# Patient Record
Sex: Female | Born: 1980 | Race: Black or African American | Hispanic: No | Marital: Single | Smoking: Former smoker
Health system: Southern US, Community
[De-identification: ages and names within clinical notes are randomized; demographics above are authoritative.]

## PROBLEM LIST (undated history)

## (undated) DIAGNOSIS — I10 Essential (primary) hypertension: Secondary | ICD-10-CM

## (undated) DIAGNOSIS — R569 Unspecified convulsions: Secondary | ICD-10-CM

## (undated) HISTORY — DX: Unspecified convulsions: R56.9

## (undated) HISTORY — PX: ANKLE SURGERY: SHX546

---

## 2020-10-06 ENCOUNTER — Encounter (HOSPITAL_COMMUNITY): Payer: Self-pay

## 2020-10-06 ENCOUNTER — Other Ambulatory Visit: Payer: Self-pay

## 2020-10-06 ENCOUNTER — Emergency Department (HOSPITAL_COMMUNITY)
Admission: EM | Admit: 2020-10-06 | Discharge: 2020-10-06 | Disposition: A | Discharge status: Home / Self Care | Payer: Medicaid - Out of State | Attending: Emergency Medicine | Admitting: Emergency Medicine

## 2020-10-06 ENCOUNTER — Emergency Department (HOSPITAL_COMMUNITY): Payer: Medicaid - Out of State

## 2020-10-06 DIAGNOSIS — R110 Nausea: Secondary | ICD-10-CM | POA: Insufficient documentation

## 2020-10-06 DIAGNOSIS — I471 Supraventricular tachycardia: Secondary | ICD-10-CM | POA: Diagnosis not present

## 2020-10-06 DIAGNOSIS — R002 Palpitations: Secondary | ICD-10-CM | POA: Diagnosis present

## 2020-10-06 LAB — CBC WITH DIFFERENTIAL/PLATELET
Abs Immature Granulocytes: 0.04 10*3/uL (ref 0.00–0.07)
Basophils Absolute: 0 10*3/uL (ref 0.0–0.1)
Basophils Relative: 0 %
Eosinophils Absolute: 0 10*3/uL (ref 0.0–0.5)
Eosinophils Relative: 1 %
HCT: 36.9 % (ref 36.0–46.0)
Hemoglobin: 11.4 g/dL — ABNORMAL LOW (ref 12.0–15.0)
Immature Granulocytes: 1 %
Lymphocytes Relative: 26 %
Lymphs Abs: 2 10*3/uL (ref 0.7–4.0)
MCH: 25.5 pg — ABNORMAL LOW (ref 26.0–34.0)
MCHC: 30.9 g/dL (ref 30.0–36.0)
MCV: 82.6 fL (ref 80.0–100.0)
Monocytes Absolute: 0.2 10*3/uL (ref 0.1–1.0)
Monocytes Relative: 3 %
Neutro Abs: 5.3 10*3/uL (ref 1.7–7.7)
Neutrophils Relative %: 69 %
Platelets: 357 10*3/uL (ref 150–400)
RBC: 4.47 MIL/uL (ref 3.87–5.11)
RDW: 20.9 % — ABNORMAL HIGH (ref 11.5–15.5)
WBC: 7.6 10*3/uL (ref 4.0–10.5)
nRBC: 0 % (ref 0.0–0.2)

## 2020-10-06 LAB — COMPREHENSIVE METABOLIC PANEL
ALT: 45 U/L — ABNORMAL HIGH (ref 0–44)
AST: 77 U/L — ABNORMAL HIGH (ref 15–41)
Albumin: 3.7 g/dL (ref 3.5–5.0)
Alkaline Phosphatase: 69 U/L (ref 38–126)
Anion gap: 11 (ref 5–15)
BUN: 9 mg/dL (ref 6–20)
CO2: 20 mmol/L — ABNORMAL LOW (ref 22–32)
Calcium: 9 mg/dL (ref 8.9–10.3)
Chloride: 102 mmol/L (ref 98–111)
Creatinine, Ser: 1.02 mg/dL — ABNORMAL HIGH (ref 0.44–1.00)
GFR, Estimated: >60 mL/min (ref >60)
Glucose, Bld: 100 mg/dL — ABNORMAL HIGH (ref 70–99)
Potassium: 3.3 mmol/L — ABNORMAL LOW (ref 3.5–5.1)
Sodium: 133 mmol/L — ABNORMAL LOW (ref 135–145)
Total Bilirubin: 1.1 mg/dL (ref 0.3–1.2)
Total Protein: 7.1 g/dL (ref 6.5–8.1)

## 2020-10-06 LAB — URINALYSIS, ROUTINE W REFLEX MICROSCOPIC
Bilirubin Urine: NEGATIVE
Glucose, UA: NEGATIVE mg/dL
Hgb urine dipstick: NEGATIVE
Ketones, ur: NEGATIVE mg/dL
Leukocytes,Ua: NEGATIVE
Nitrite: NEGATIVE
Protein, ur: NEGATIVE mg/dL
Specific Gravity, Urine: 1.004 — ABNORMAL LOW (ref 1.005–1.030)
pH: 5 (ref 5.0–8.0)

## 2020-10-06 LAB — PREGNANCY, URINE: Preg Test, Ur: NEGATIVE

## 2020-10-06 LAB — TSH: TSH: 2.239 u[IU]/mL (ref 0.350–4.500)

## 2020-10-06 IMAGING — DX DG CHEST 1V PORT
1 series · 1 of 1 positions shown · non-contrast
Comparison: None.

CLINICAL DATA: SVT

EXAM:
PORTABLE CHEST 1 VIEW

[chest]
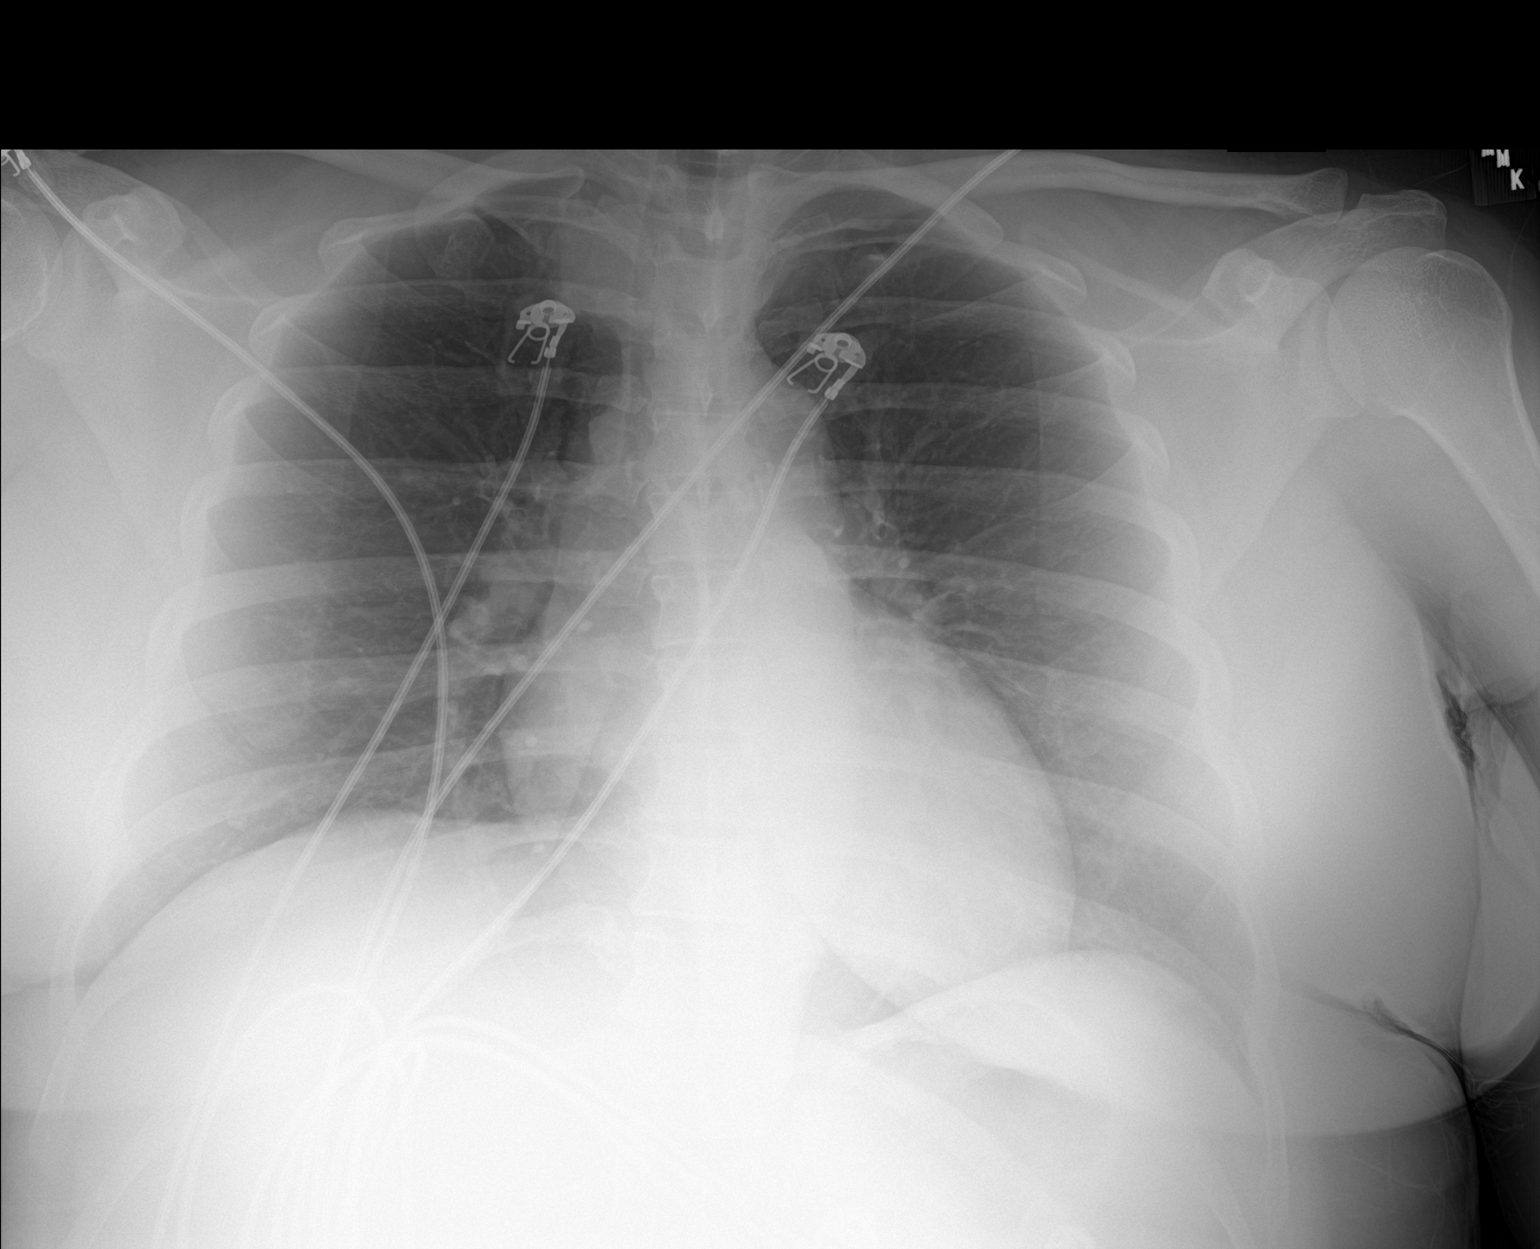

[1 of 1 positions shown; findings below may reference images not displayed]

FINDINGS: The heart size and mediastinal contours are within normal limits.
Both lungs are clear. The visualized skeletal structures are
unremarkable.
IMPRESSION: No active disease.

## 2020-10-06 NOTE — ED Triage Notes (Signed)
Pt BIB [facility] from home c/o [facility] and chest tightness for 2-3 days. Called EMS today when she started feeling chest tightness per EMS pt was in SVT with HR in the 200's. Pt received 6mg  of adenosine without conversion. Pt then received 12 mg of adenosine and converted. Pt is now ST at 103. Pt also received 500 NS with EMS. Pt denies any pain at this time.

## 2020-10-06 NOTE — ED Provider Notes (Signed)
[facility] EMERGENCY DEPARTMENT Provider Note   CSN: [id number] Arrival date & time: 10/06/20  1653     History Chief Complaint  Patient presents with   svt    Sharon Hanson is a 40 y.o. female.  Pt was in SVT with EMS.  Pt was given adenosine 6mg  with no relief and 12 mg with return to sinus rhythm. Pt reports she had a similar episode in the past and she vomited and it resolved.  Pt denies any medical problems.  The history is provided by the patient. No language interpreter was used.  Palpitations Palpitations quality:  Fast Onset quality:  Unable to specify Duration:  1 day Timing:  Constant Progression:  Worsening Chronicity:  New Context: not anxiety, not caffeine, not exercise, not hyperventilation and not illicit drugs   Relieved by:  Nothing Worsened by:  Nothing Ineffective treatments:  None tried Associated symptoms: nausea   Risk factors: no diabetes mellitus       History reviewed. No pertinent past medical history.  There are no problems to display for this patient.      OB History   No obstetric history on file.     History reviewed. No pertinent family history.     Home Medications Prior to Admission medications   Not on File    Allergies    Patient has no known allergies.  Review of Systems   Review of Systems  Cardiovascular:  Positive for palpitations.  Gastrointestinal:  Positive for nausea.  All other systems reviewed and are negative.  Physical Exam Updated Vital Signs BP (!) 161/125   Pulse (!) 106   Resp (!) 21   Ht 5\' 7"  (1.702 m)   Wt 117.9 kg   LMP 10/03/2020   SpO2 100%   BMI 40.72 kg/m   Physical Exam Vitals and nursing note reviewed.  Constitutional:      Appearance: She is well-developed.  HENT:     Head: Normocephalic.     Mouth/Throat:     Mouth: Mucous membranes are moist.  Cardiovascular:     Rate and Rhythm: Tachycardia present.  Pulmonary:     Effort: Pulmonary effort is  normal.  Abdominal:     General: Abdomen is flat. There is no distension.     Palpations: Abdomen is soft.  Musculoskeletal:        General: Normal range of motion.     Cervical back: Normal range of motion.  Skin:    General: Skin is warm.  Neurological:     General: No focal deficit present.     Mental Status: She is alert and oriented to person, place, and time.  Psychiatric:        Mood and Affect: Mood normal.    ED Results / Procedures / Treatments   Labs (all labs ordered are listed, but only abnormal results are displayed) Labs Reviewed  TSH  CBC WITH DIFFERENTIAL/PLATELET  COMPREHENSIVE METABOLIC PANEL  URINALYSIS, ROUTINE W REFLEX MICROSCOPIC  TSH  PREGNANCY, URINE    EKG EKG Interpretation  Date/Time:  Monday October 06 2020 17:18:22 EDT Ventricular Rate:  112 PR Interval:  117 QRS Duration: 94 QT Interval:  330 QTC Calculation: 451 R Axis:   67 Text Interpretation: Sinus tachycardia No significant change since last tracing Confirmed by Saturday [phone]) on 10/06/2020 5:23:08 PM  Radiology DG Chest Port 1 View  Result Date: 10/06/2020 CLINICAL DATA:  SVT EXAM: PORTABLE CHEST 1 VIEW COMPARISON:  None. FINDINGS:  The heart size and mediastinal contours are within normal limits. Both lungs are clear. The visualized skeletal structures are unremarkable. IMPRESSION: No active disease. Electronically Signed   By: Jasmine Pang M.D.   On: 10/06/2020 18:12                            Insert SmartText                                                                        Important            Important             Important            Important               Excuses           Preview All       Print All             Suggestion                 Work         [employer]           Caregiver            TRX Med Necessity   Procedures Procedures   Medications Ordered in ED Medications - No data to display  ED Course  I have reviewed the triage vital signs and the nursing notes.  Pertinent labs & imaging results that were available during my care of the patient were reviewed by me and considered in my medical decision making (see chart for details).    MDM Rules/Calculators/A&P                            Final Clinical Impression(s) / ED Diagnoses Final diagnoses:  SVT (supraventricular tachycardia) (HCC)    Rx / DC Orders ED Discharge Orders     None     Pt's care turned over to Dr. Adela Lank.  Labs pending   Osie Cheeks 10/07/20 1246    Melene Plan, DO 10/07/20 2317

## 2020-10-06 NOTE — ED Provider Notes (Signed)
I provided a substantive portion of the care of this patient.  I personally performed the entirety of the exam and medical decision making for this encounter.  Patient with SVT.  Has a history in the past that resolved after vomiting.  She is now in a sinus rhythm.  Persistently sinus tachycardia.  Feel less likely to be PE.  Family feels like it stress related.  Thyroid studies negative not pregnant no significant anemia no significant electrolyte abnormality.  We will have her follow-up with a cardiologist in the office.  EKG Interpretation  Date/Time:  Monday October 06 2020 17:18:22 EDT Ventricular Rate:  112 PR Interval:  117 QRS Duration: 94 QT Interval:  330 QTC Calculation: 451 R Axis:   67 Text Interpretation: Sinus tachycardia No significant change since last tracing Confirmed by Melene Plan [phone]) on 10/06/2020 5:23:08 PM   See the written copy of this report in the patient's paper medical record.  These results did not interface directly into the electronic medical record and are summarized here.    Melene Plan, DO 10/06/20 2034

## 2020-10-06 NOTE — Discharge Instructions (Addendum)
Try not to take anything that can speed your heart rate up, caffeine stimulants like methamphetamines or cocaine.  Please follow-up with your doctor in the office.

## 2021-01-08 ENCOUNTER — Observation Stay (HOSPITAL_COMMUNITY): Payer: Self-pay

## 2021-01-08 ENCOUNTER — Encounter (HOSPITAL_COMMUNITY): Payer: Self-pay | Admitting: Oncology

## 2021-01-08 ENCOUNTER — Inpatient Hospital Stay (HOSPITAL_COMMUNITY)
Admission: EM | Admit: 2021-01-08 | Discharge: 2021-01-13 | DRG: 227 | Disposition: A | Discharge status: Home / Self Care | Payer: Self-pay | Attending: Internal Medicine | Admitting: Internal Medicine

## 2021-01-08 ENCOUNTER — Emergency Department (HOSPITAL_COMMUNITY): Payer: Self-pay

## 2021-01-08 ENCOUNTER — Other Ambulatory Visit: Payer: Self-pay

## 2021-01-08 DIAGNOSIS — I493 Ventricular premature depolarization: Secondary | ICD-10-CM | POA: Diagnosis present

## 2021-01-08 DIAGNOSIS — I10 Essential (primary) hypertension: Secondary | ICD-10-CM | POA: Diagnosis present

## 2021-01-08 DIAGNOSIS — E871 Hypo-osmolality and hyponatremia: Secondary | ICD-10-CM | POA: Diagnosis present

## 2021-01-08 DIAGNOSIS — Z20822 Contact with and (suspected) exposure to covid-19: Secondary | ICD-10-CM | POA: Diagnosis present

## 2021-01-08 DIAGNOSIS — I471 Supraventricular tachycardia: Secondary | ICD-10-CM | POA: Diagnosis present

## 2021-01-08 DIAGNOSIS — R030 Elevated blood-pressure reading, without diagnosis of hypertension: Secondary | ICD-10-CM

## 2021-01-08 DIAGNOSIS — R7401 Elevation of levels of liver transaminase levels: Secondary | ICD-10-CM | POA: Diagnosis present

## 2021-01-08 DIAGNOSIS — R569 Unspecified convulsions: Secondary | ICD-10-CM

## 2021-01-08 DIAGNOSIS — Z95818 Presence of other cardiac implants and grafts: Secondary | ICD-10-CM

## 2021-01-08 DIAGNOSIS — I472 Ventricular tachycardia, unspecified: Principal | ICD-10-CM | POA: Diagnosis present

## 2021-01-08 DIAGNOSIS — Z6838 Body mass index (BMI) 38.0-38.9, adult: Secondary | ICD-10-CM

## 2021-01-08 DIAGNOSIS — E876 Hypokalemia: Secondary | ICD-10-CM | POA: Diagnosis present

## 2021-01-08 DIAGNOSIS — E669 Obesity, unspecified: Secondary | ICD-10-CM | POA: Diagnosis present

## 2021-01-08 DIAGNOSIS — F419 Anxiety disorder, unspecified: Secondary | ICD-10-CM | POA: Diagnosis present

## 2021-01-08 DIAGNOSIS — W19XXXA Unspecified fall, initial encounter: Secondary | ICD-10-CM

## 2021-01-08 DIAGNOSIS — M25569 Pain in unspecified knee: Secondary | ICD-10-CM

## 2021-01-08 DIAGNOSIS — I255 Ischemic cardiomyopathy: Secondary | ICD-10-CM | POA: Diagnosis present

## 2021-01-08 DIAGNOSIS — G40909 Epilepsy, unspecified, not intractable, without status epilepticus: Secondary | ICD-10-CM | POA: Diagnosis present

## 2021-01-08 DIAGNOSIS — Z87891 Personal history of nicotine dependence: Secondary | ICD-10-CM

## 2021-01-08 HISTORY — DX: Essential (primary) hypertension: I10

## 2021-01-08 LAB — COMPREHENSIVE METABOLIC PANEL
ALT: 67 U/L — ABNORMAL HIGH (ref 0–44)
AST: 93 U/L — ABNORMAL HIGH (ref 15–41)
Albumin: 4.1 g/dL (ref 3.5–5.0)
Alkaline Phosphatase: 99 U/L (ref 38–126)
Anion gap: 13 (ref 5–15)
BUN: 13 mg/dL (ref 6–20)
CO2: 21 mmol/L — ABNORMAL LOW (ref 22–32)
Calcium: 8.2 mg/dL — ABNORMAL LOW (ref 8.9–10.3)
Chloride: 99 mmol/L (ref 98–111)
Creatinine, Ser: 1.1 mg/dL — ABNORMAL HIGH (ref 0.44–1.00)
GFR, Estimated: >60 mL/min (ref >60)
Glucose, Bld: 111 mg/dL — ABNORMAL HIGH (ref 70–99)
Potassium: 3.4 mmol/L — ABNORMAL LOW (ref 3.5–5.1)
Sodium: 133 mmol/L — ABNORMAL LOW (ref 135–145)
Total Bilirubin: 1.2 mg/dL (ref 0.3–1.2)
Total Protein: 7.8 g/dL (ref 6.5–8.1)

## 2021-01-08 LAB — ETHANOL: Alcohol, Ethyl (B): <10 mg/dL (ref <10)

## 2021-01-08 LAB — RESP PANEL BY RT-PCR (FLU A&B, COVID) ARPGX2
Influenza A by PCR: NEGATIVE
Influenza B by PCR: NEGATIVE
SARS Coronavirus 2 by RT PCR: NEGATIVE

## 2021-01-08 LAB — BASIC METABOLIC PANEL
Anion gap: 13 (ref 5–15)
BUN: 7 mg/dL (ref 6–20)
CO2: 20 mmol/L — ABNORMAL LOW (ref 22–32)
Calcium: 8.1 mg/dL — ABNORMAL LOW (ref 8.9–10.3)
Chloride: 101 mmol/L (ref 98–111)
Creatinine, Ser: 1.09 mg/dL — ABNORMAL HIGH (ref 0.44–1.00)
GFR, Estimated: >60 mL/min (ref >60)
Glucose, Bld: 98 mg/dL (ref 70–99)
Potassium: 3.3 mmol/L — ABNORMAL LOW (ref 3.5–5.1)
Sodium: 134 mmol/L — ABNORMAL LOW (ref 135–145)

## 2021-01-08 LAB — CBC WITH DIFFERENTIAL/PLATELET
Abs Immature Granulocytes: 0.05 10*3/uL (ref 0.00–0.07)
Basophils Absolute: 0 10*3/uL (ref 0.0–0.1)
Basophils Relative: 0 %
Eosinophils Absolute: 0 10*3/uL (ref 0.0–0.5)
Eosinophils Relative: 0 %
HCT: 39.8 % (ref 36.0–46.0)
Hemoglobin: 12.5 g/dL (ref 12.0–15.0)
Immature Granulocytes: 1 %
Lymphocytes Relative: 16 %
Lymphs Abs: 1.3 10*3/uL (ref 0.7–4.0)
MCH: 26.4 pg (ref 26.0–34.0)
MCHC: 31.4 g/dL (ref 30.0–36.0)
MCV: 84.1 fL (ref 80.0–100.0)
Monocytes Absolute: 0.2 10*3/uL (ref 0.1–1.0)
Monocytes Relative: 3 %
Neutro Abs: 6.6 10*3/uL (ref 1.7–7.7)
Neutrophils Relative %: 80 %
Platelets: 350 10*3/uL (ref 150–400)
RBC: 4.73 MIL/uL (ref 3.87–5.11)
RDW: 21.2 % — ABNORMAL HIGH (ref 11.5–15.5)
WBC: 8.3 10*3/uL (ref 4.0–10.5)
nRBC: 0 % (ref 0.0–0.2)

## 2021-01-08 LAB — I-STAT BETA HCG BLOOD, ED (MC, WL, AP ONLY): I-stat hCG, quantitative: <5 m[IU]/mL (ref <5)

## 2021-01-08 LAB — MAGNESIUM
Magnesium: 2.2 mg/dL (ref 1.7–2.4)
Magnesium: 1.9 mg/dL (ref 1.7–2.4)

## 2021-01-08 IMAGING — CT CT HEAD W/O CM
3 series · 15 of 47 positions shown, 18 images · non-contrast
Comparison: None.

CLINICAL DATA: Seizure, nontraumatic (Age 18-40y)

EXAM:
CT HEAD WITHOUT CONTRAST
TECHNIQUE: Contiguous axial images were obtained from the base of the skull
through the vertex without intravenous contrast.

[Series 2: head wo · axial · 0.47mm/px · z∈[-130,+0]mm · 9 of 32 slices shown, 12 images]
[im 3/32  brain]
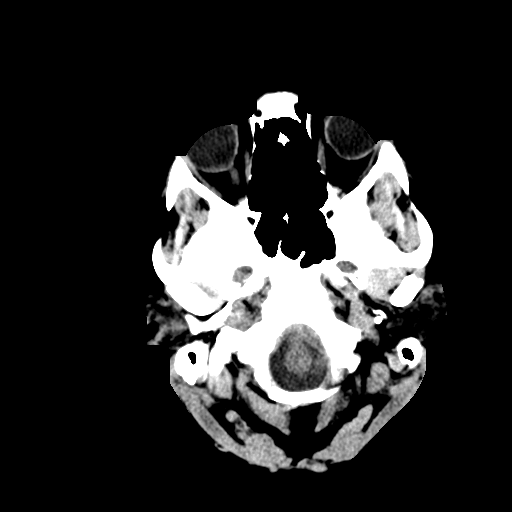
[im 3/32  bone]
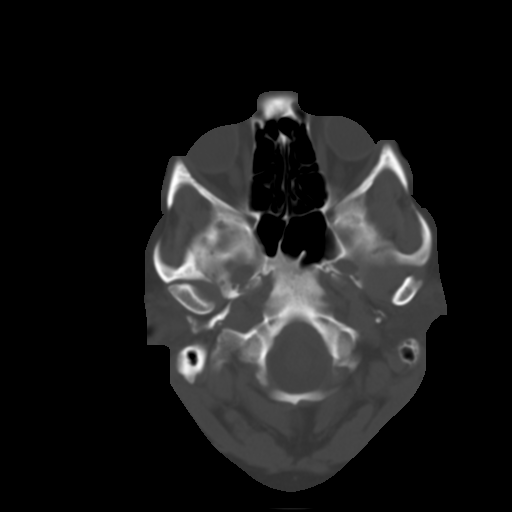
[im 6/32  brain]
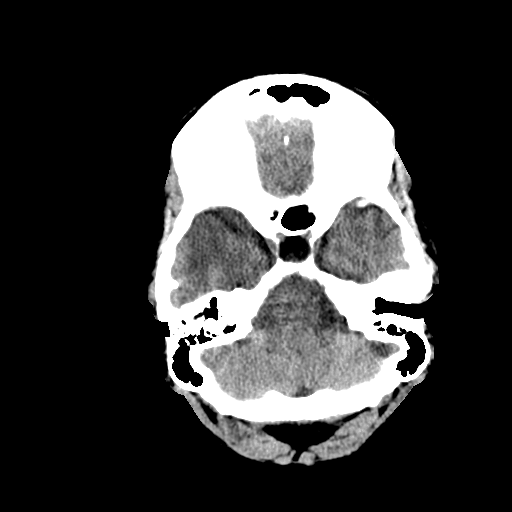
[im 9/32  brain]
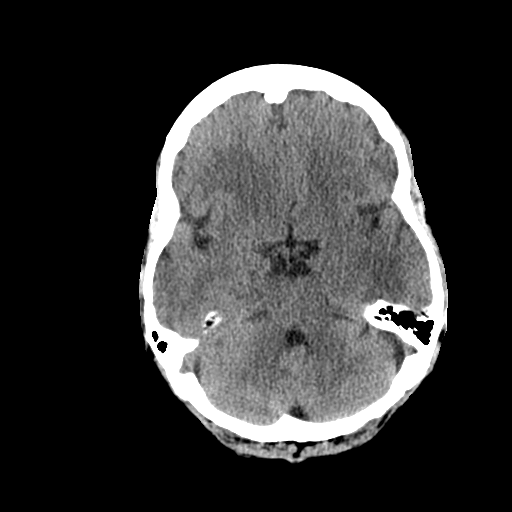
[im 12/32  brain]
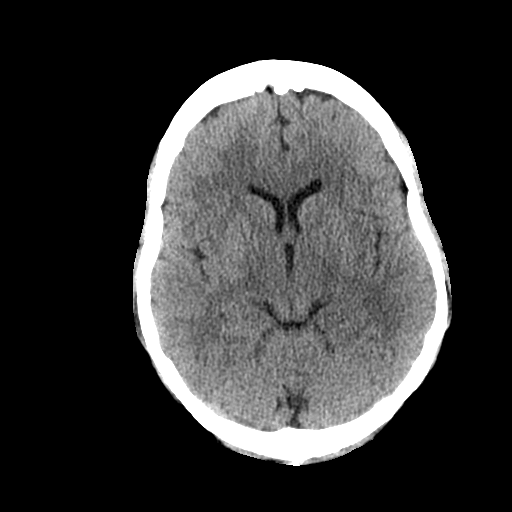
[im 17/32  brain]
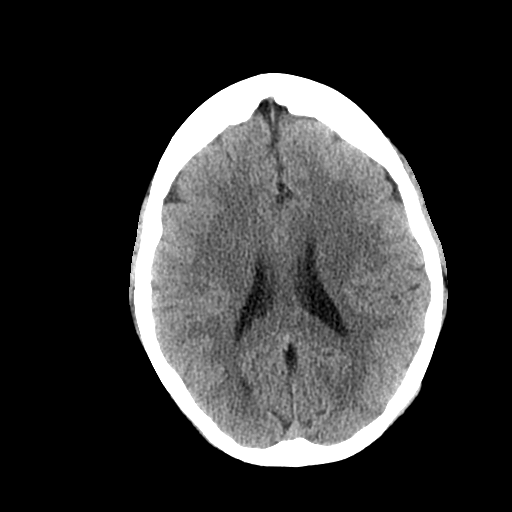
[im 17/32  bone]
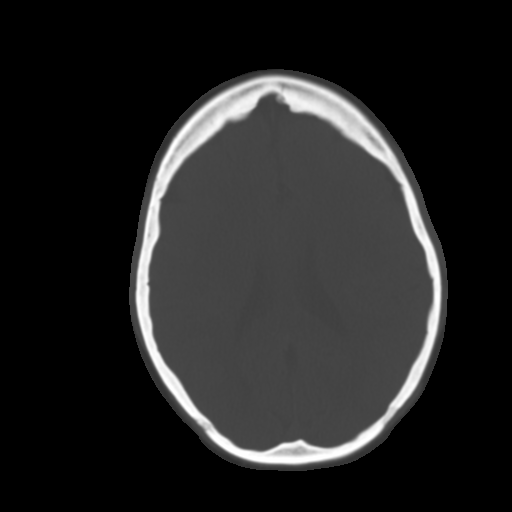
[im 20/32  brain]
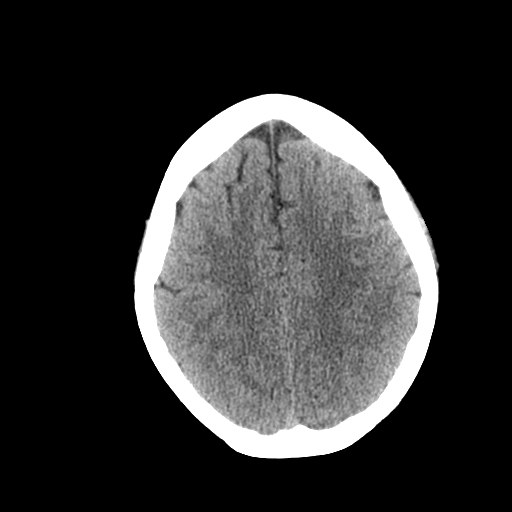
[im 23/32  brain]
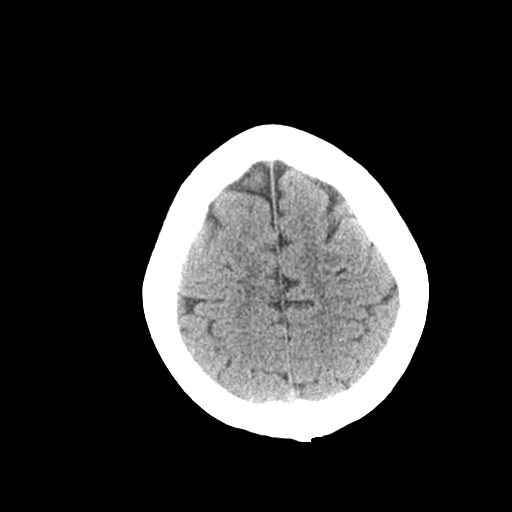
[im 26/32  brain]
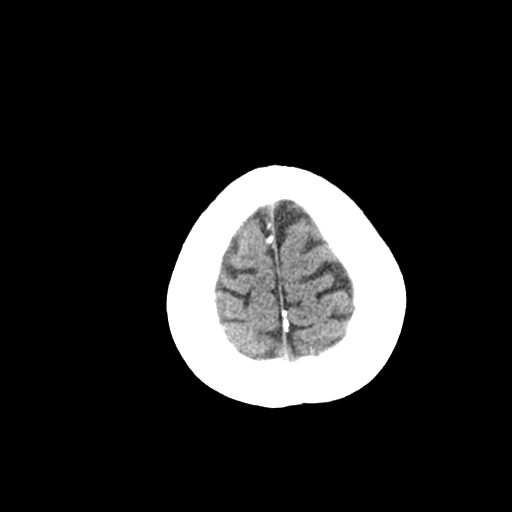
[im 29/32  brain]
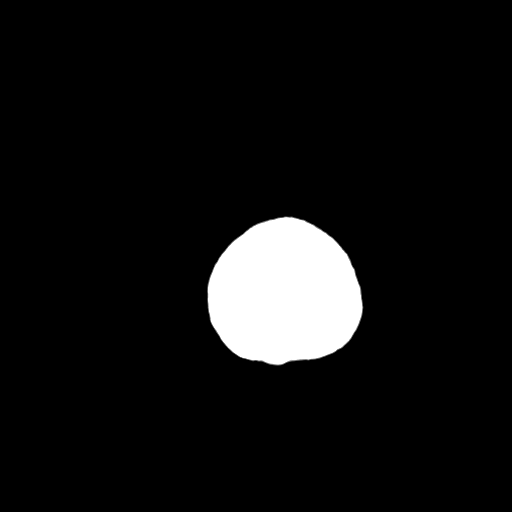
[im 29/32  bone]
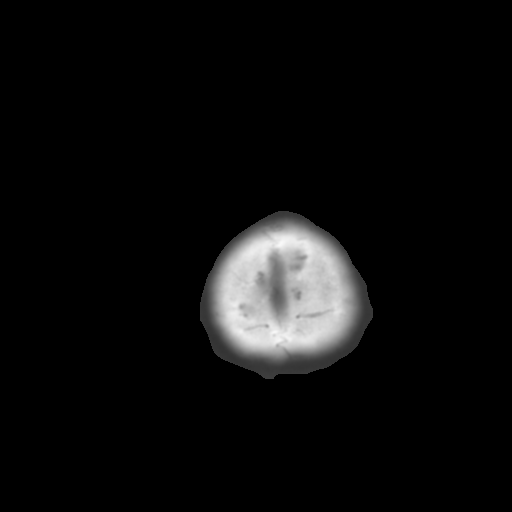

[Series 5: coronal soft tissue · coronal · 0.33mm/px · 3 of 74 slices shown]
[im 25/74  brain]
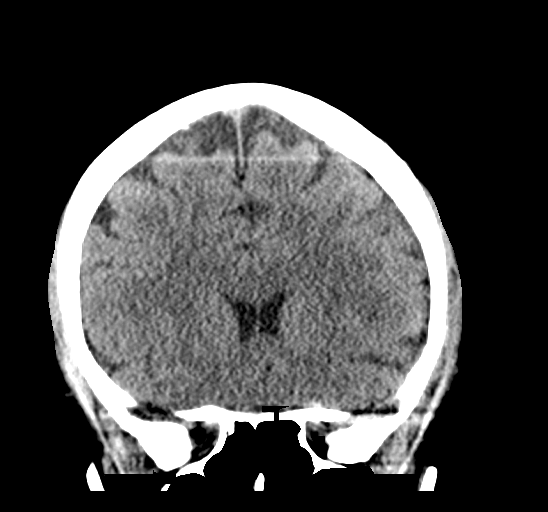
[im 33/74  brain]
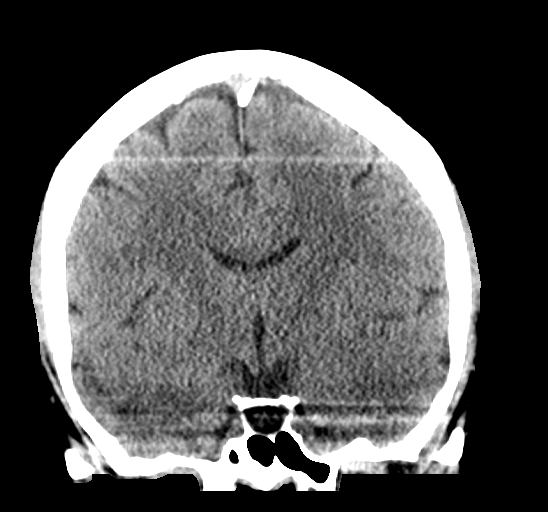
[im 41/74  brain]
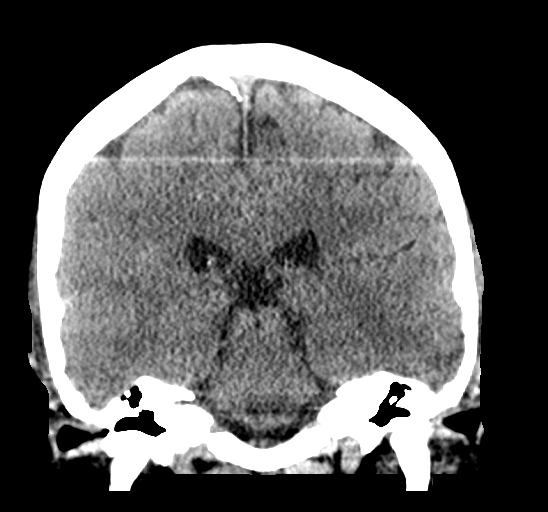

[Series 6: sagittal soft tissue · sagittal · 0.31mm/px · 3 of 64 slices shown]
[im 22/64  brain]
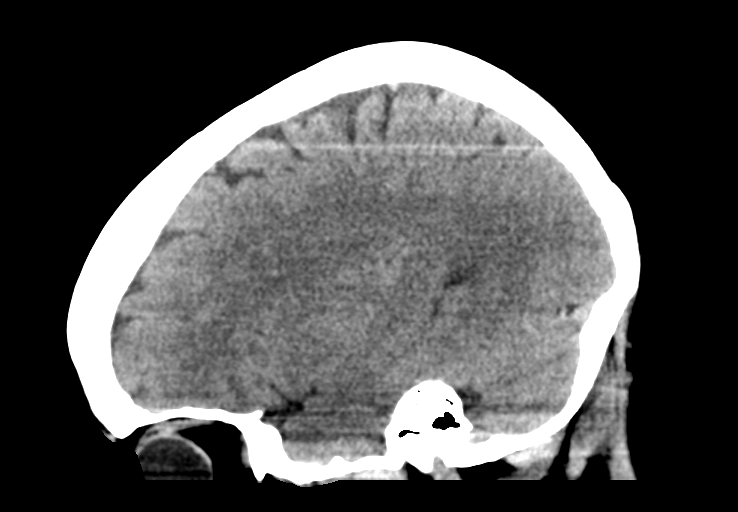
[im 32/64  brain]
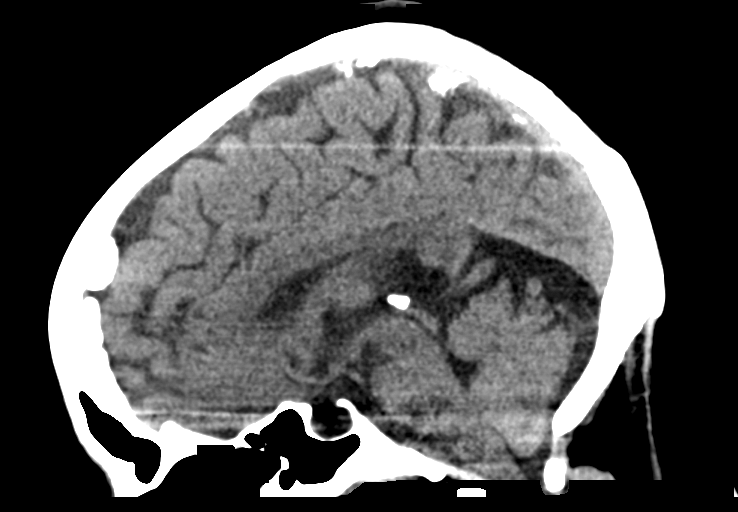
[im 43/64  brain]
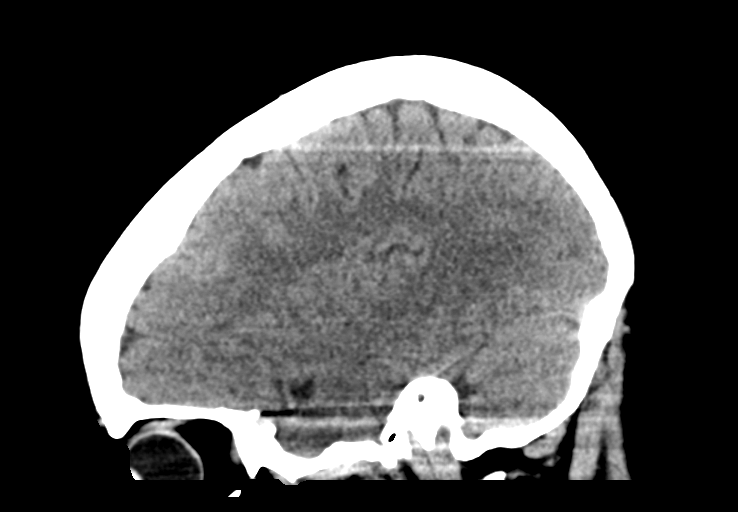

[15 of 47 positions shown; findings below may reference images not displayed]

FINDINGS: Brain: No acute intracranial abnormality. Specifically, no
hemorrhage, hydrocephalus, mass lesion, acute infarction, or
significant intracranial injury.

Vascular: No hyperdense vessel or unexpected calcification.

Skull: No acute calvarial abnormality.

Sinuses/Orbits: No acute findings

Other: None
IMPRESSION: Normal study.

## 2021-01-08 IMAGING — MR MR HEAD W/O CM
11 series · 48 of 48 positions shown · non-contrast
Comparison: None.

CLINICAL DATA: Seizure, nontraumatic (Age 18-40y)

EXAM:
MRI HEAD WITHOUT CONTRAST
TECHNIQUE: Multiplanar, multiecho pulse sequences of the brain and surrounding
structures were obtained without intravenous contrast.

[Series 5: DWI · axial · 5.0mm · 1.31mm/px · z∈[-74,+80]mm · 4 of 64 slices shown (1 of 4)]
[im 1/64]
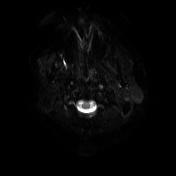
[im 22/64]
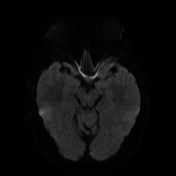
[im 43/64]
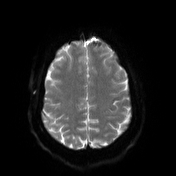
[im 64/64]
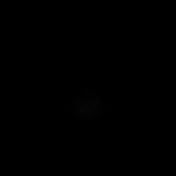

[Series 6: DWI · axial · 5.0mm · 1.31mm/px · z∈[-74,+80]mm · 2 of 32 slices shown (2 of 4)]
[im 1/32]
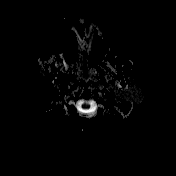
[im 32/32]
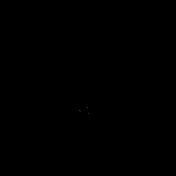

[Series 7: T1 · sagittal · 5.0mm · 0.72mm/px · 2 of 24 slices shown (1 of 2)]
[im 1/24]
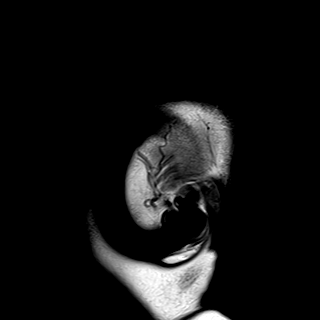
[im 24/24]
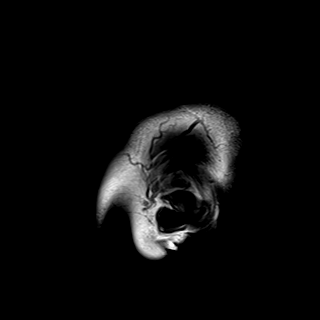

[Series 8: T2 · axial · 5.0mm · 0.60mm/px · z∈[-72,+82]mm · 3 of 32 slices shown (1 of 2)]
[im 1/32]
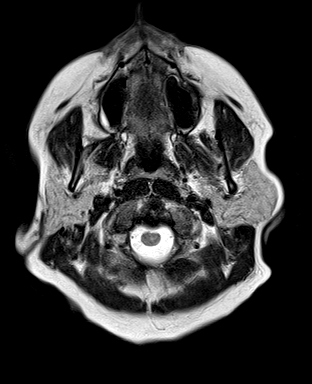
[im 16/32]
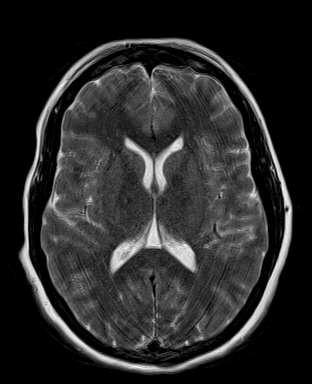
[im 32/32]
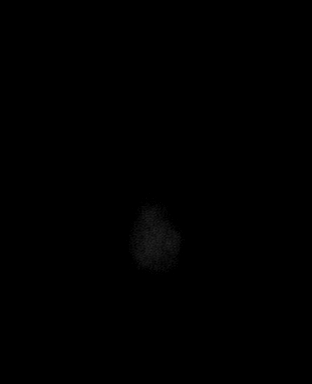

[Series 9: swi_images · axial · 3.0mm · 0.75mm/px · z∈[-77,+87]mm · 5 of 56 slices shown]
[im 1/56]
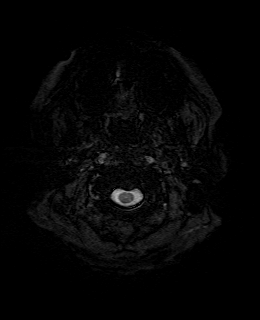
[im 14/56]
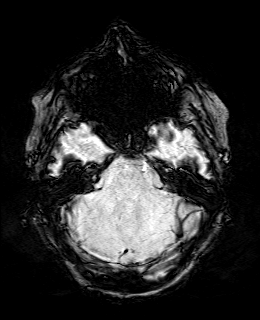
[im 28/56]
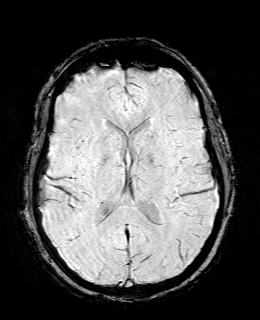
[im 42/56]
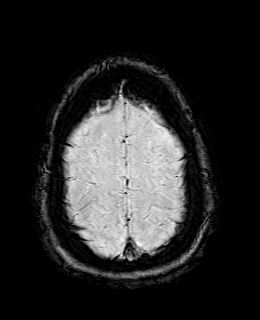
[im 56/56]
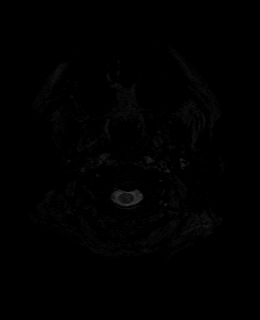

[Series 11: FLAIR · axial · 5.0mm · 0.75mm/px · z∈[-72,+82]mm · 3 of 32 slices shown (1 of 2)]
[im 1/32]
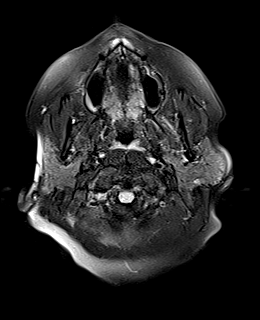
[im 16/32]
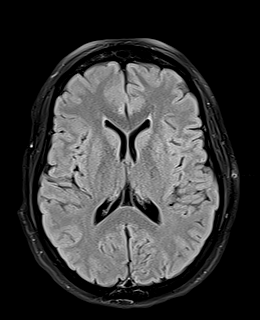
[im 32/32]
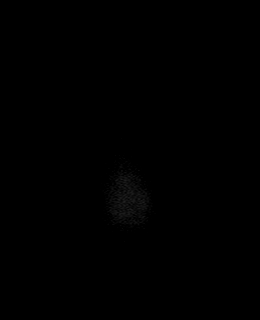

[Series 12: T2 · coronal · 3.0mm · 0.56mm/px · 3 of 34 slices shown (2 of 2)]
[im 1/34]
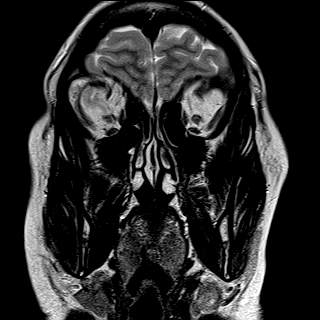
[im 17/34]
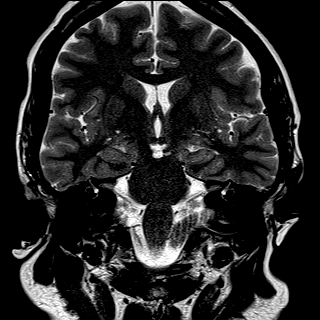
[im 34/34]
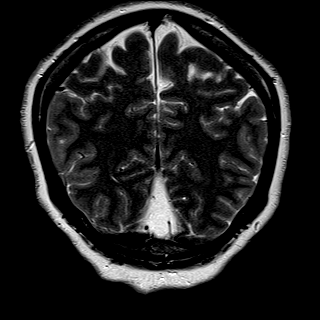

[Series 13: FLAIR · coronal · 3.0mm · 0.70mm/px · 3 of 34 slices shown (2 of 2)]
[im 1/34]
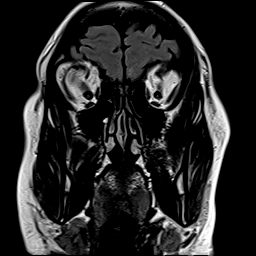
[im 17/34]
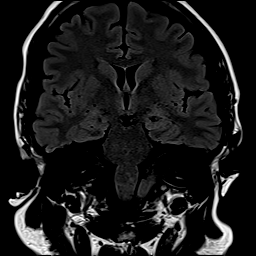
[im 34/34]
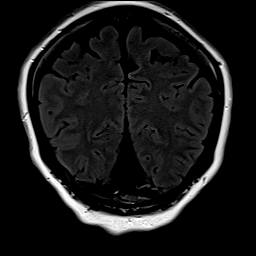

[Series 14: DWI · coronal · 5.0mm · 1.31mm/px · 5 of 63 slices shown (3 of 4)]
[im 1/63]
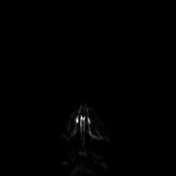
[im 16/63]
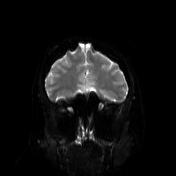
[im 32/63]
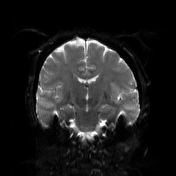
[im 47/63]
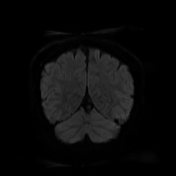
[im 63/63]
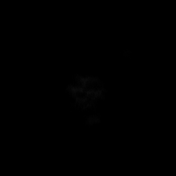

[Series 15: DWI · coronal · 5.0mm · 1.31mm/px · 3 of 32 slices shown (4 of 4)]
[im 1/32]
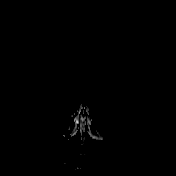
[im 16/32]
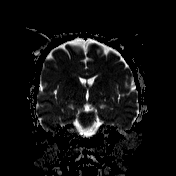
[im 32/32]
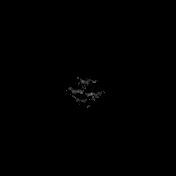

[Series 16: T1 · axial · 1.0mm · 0.94mm/px · z∈[-89,+85]mm · 15 of 176 slices shown (2 of 2)]
[im 1/176]
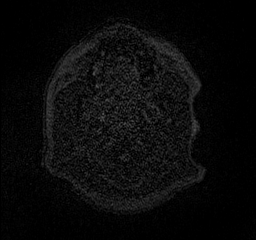
[im 13/176]
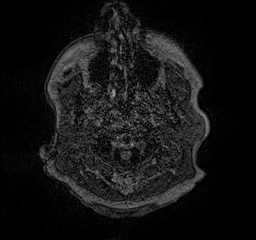
[im 26/176]
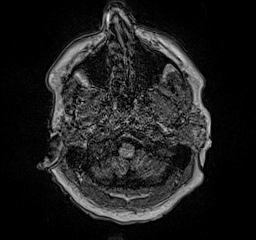
[im 38/176]
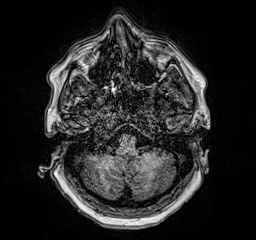
[im 51/176]
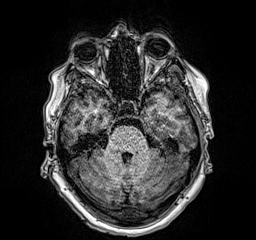
[im 63/176]
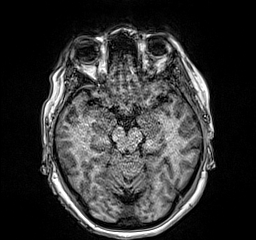
[im 76/176]
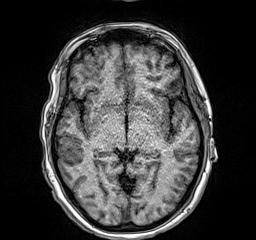
[im 88/176]
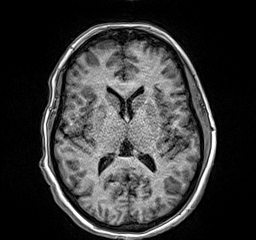
[im 101/176]
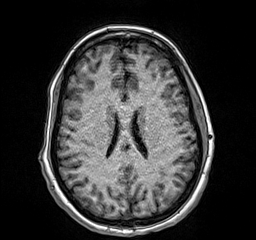
[im 113/176]
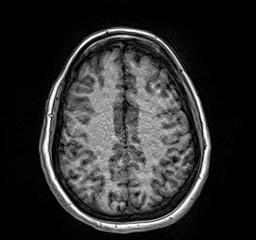
[im 126/176]
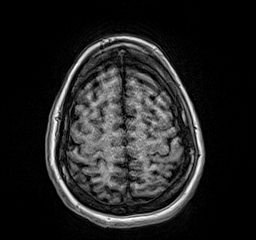
[im 138/176]
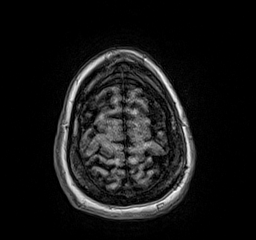
[im 151/176]
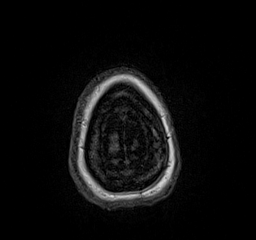
[im 163/176]
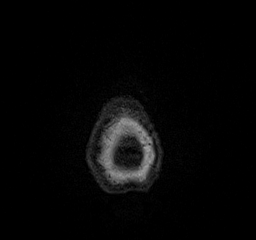
[im 176/176]
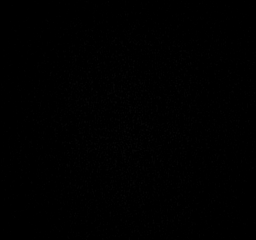

[48 of 48 positions shown; findings below may reference images not displayed]

FINDINGS: Mildly motion study.

Brain: No acute infarction, hemorrhage, hydrocephalus, extra-axial
collection or mass lesion. The hippocampi are within normal limits
and symmetric in size/signal. Partially empty and expanded sella.

Vascular: Major arterial flow voids are maintained at the skull
base.

Skull and upper cervical spine: Normal marrow signal.

Sinuses/Orbits: Clear sinuses.  Unremarkable orbits.

Other: No mastoid effusions.
IMPRESSION: 1. No evidence of acute abnormality.
2. Partially empty and expanded sella, which is often a normal
anatomic variant but can be associated with idiopathic intracranial
hypertension.

## 2021-01-08 IMAGING — DX DG KNEE COMPLETE 4+V*R*
4 series · 4 of 4 positions shown · non-contrast
Comparison: None.

CLINICAL DATA: Fall, right knee pain.

EXAM:
RIGHT KNEE - COMPLETE 4+ VIEW

[knee ap]
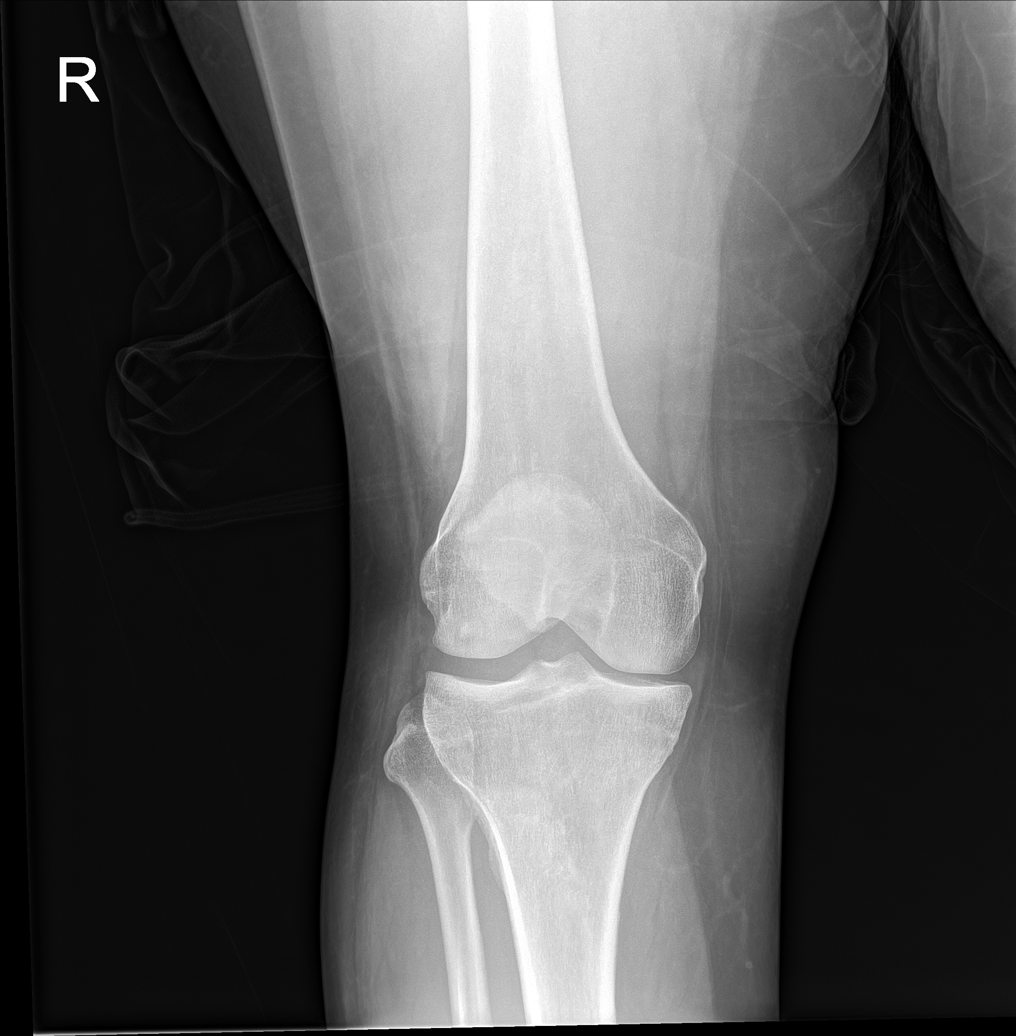

[knee lat]
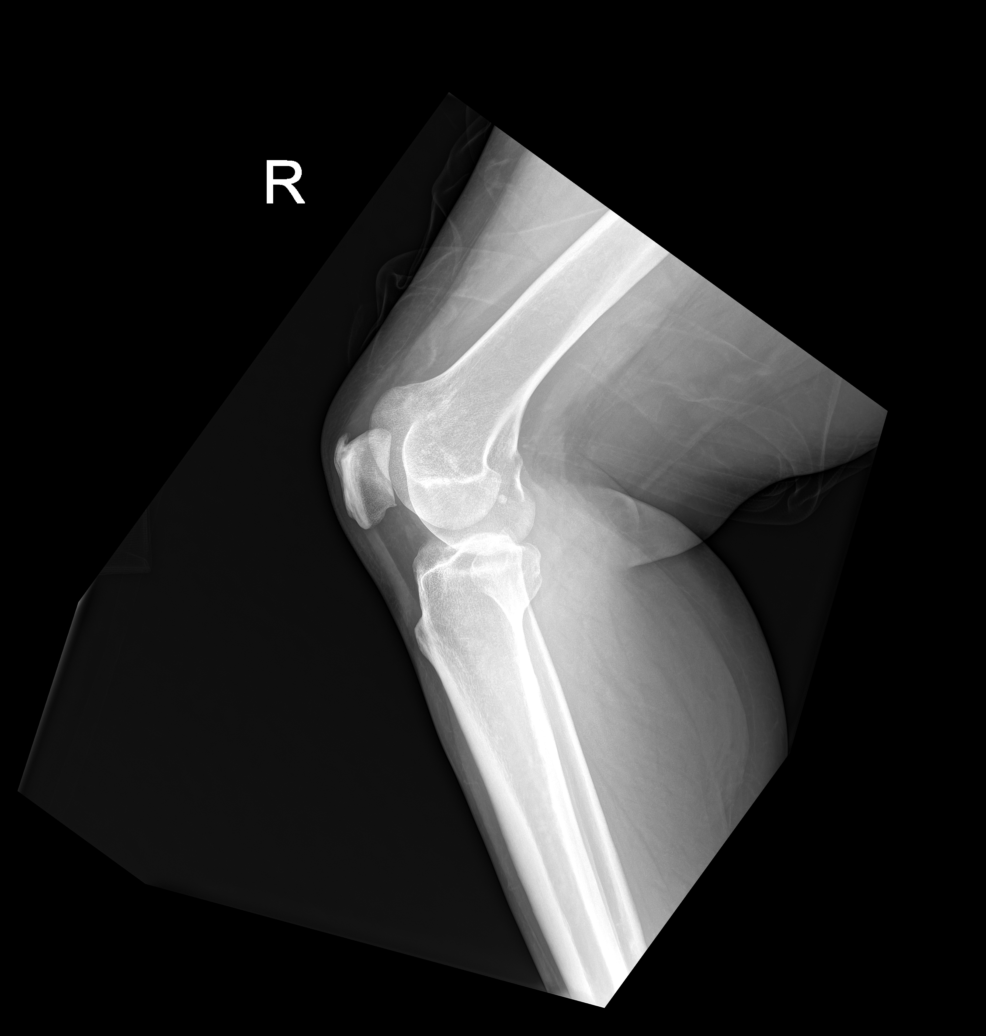

[knee obl (1 of 2)]
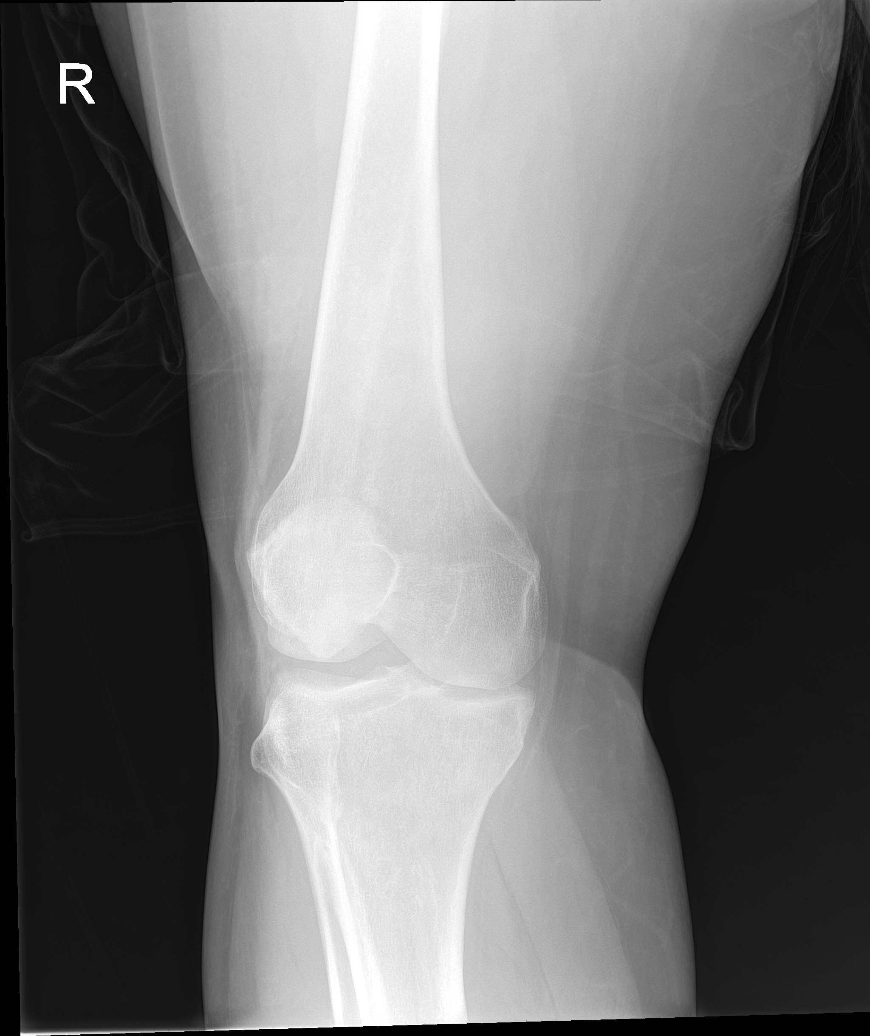

[knee obl (2 of 2)]
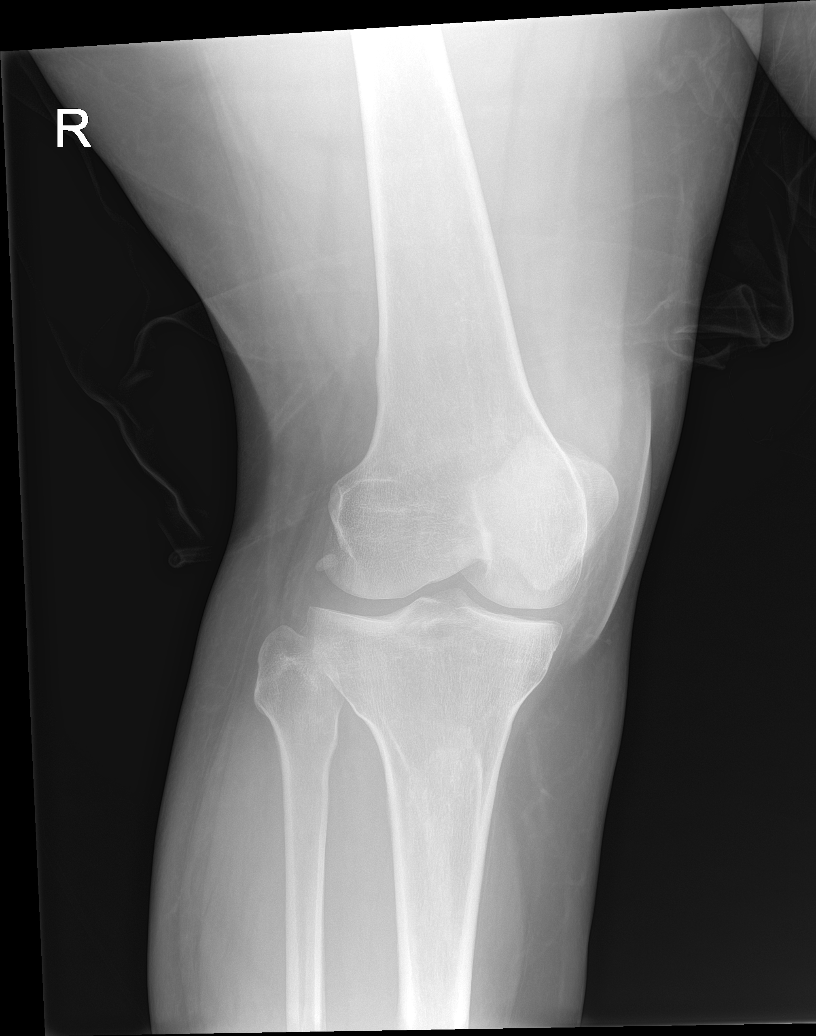

[4 of 4 positions shown; findings below may reference images not displayed]

FINDINGS: No evidence of fracture, dislocation, or joint effusion. Mild medial
tibiofemoral joint space narrowing. Trace patellofemoral spurring.
There is a small quadriceps tendon enthesophyte. Soft tissues are
unremarkable.
IMPRESSION: 1. No fracture or dislocation.
2. Mild degenerative change.
3. Small quadriceps tendon enthesophyte.

## 2021-01-08 MED ORDER — METOPROLOL TARTRATE 25 MG PO TABS
25.0000 mg | ORAL_TABLET | Freq: Two times a day (BID) | ORAL | Status: DC
  Administered 2021-01-08 – 2021-01-13 (×10): 25 mg via ORAL
  Filled 2021-01-08 (×10): qty 1

## 2021-01-08 MED ORDER — INFLUENZA VAC SPLIT QUAD 0.5 ML IM SUSY: 0.5000 mL | PREFILLED_SYRINGE | INTRAMUSCULAR | Status: DC

## 2021-01-08 MED ORDER — ACETAMINOPHEN 325 MG PO TABS
650.0000 mg | ORAL_TABLET | Freq: Four times a day (QID) | ORAL | Status: DC | PRN
  Administered 2021-01-08 – 2021-01-11 (×3): 650 mg via ORAL
  Filled 2021-01-08 (×3): qty 2

## 2021-01-08 MED ORDER — AMIODARONE HCL IN DEXTROSE 360-4.14 MG/200ML-% IV SOLN
30.0000 mg/h | INTRAVENOUS | Status: DC
  Administered 2021-01-08 – 2021-01-10 (×4): 30 mg/h via INTRAVENOUS
  Filled 2021-01-08 (×5): qty 200

## 2021-01-08 MED ORDER — AMIODARONE LOAD VIA INFUSION
150.0000 mg | Freq: Once | INTRAVENOUS | Status: AC
  Administered 2021-01-08: 18:00:00 150 mg via INTRAVENOUS
  Filled 2021-01-08: qty 83.34

## 2021-01-08 MED ORDER — LEVETIRACETAM IN NACL 500 MG/100ML IV SOLN
500.0000 mg | Freq: Two times a day (BID) | INTRAVENOUS | Status: DC
  Administered 2021-01-09 – 2021-01-11 (×5): 500 mg via INTRAVENOUS
  Filled 2021-01-08 (×5): qty 100

## 2021-01-08 MED ORDER — POTASSIUM CHLORIDE IN NACL 40-0.9 MEQ/L-% IV SOLN
INTRAVENOUS | Status: AC
  Filled 2021-01-08: qty 1000

## 2021-01-08 MED ORDER — ADENOSINE 6 MG/2ML IV SOLN
INTRAVENOUS | Status: AC
  Filled 2021-01-08: qty 4

## 2021-01-08 MED ORDER — LORAZEPAM 2 MG/ML IJ SOLN
INTRAMUSCULAR | Status: AC
  Filled 2021-01-08: qty 1

## 2021-01-08 MED ORDER — LORAZEPAM 2 MG/ML IJ SOLN
1.0000 mg | INTRAMUSCULAR | Status: DC | PRN
  Administered 2021-01-12: 1 mg via INTRAVENOUS
  Filled 2021-01-08: qty 1

## 2021-01-08 MED ORDER — ACETAMINOPHEN 650 MG RE SUPP: 650.0000 mg | Freq: Four times a day (QID) | RECTAL | Status: DC | PRN

## 2021-01-08 MED ORDER — ENOXAPARIN SODIUM 40 MG/0.4ML IJ SOSY
40.0000 mg | PREFILLED_SYRINGE | INTRAMUSCULAR | Status: DC
  Administered 2021-01-08 – 2021-01-11 (×4): 40 mg via SUBCUTANEOUS
  Filled 2021-01-08 (×4): qty 0.4

## 2021-01-08 MED ORDER — METOPROLOL TARTRATE 5 MG/5ML IV SOLN
5.0000 mg | Freq: Once | INTRAVENOUS | Status: AC
  Administered 2021-01-08: 15:00:00 5 mg via INTRAVENOUS
  Filled 2021-01-08: qty 5

## 2021-01-08 MED ORDER — LEVETIRACETAM IN NACL 1500 MG/100ML IV SOLN
1500.0000 mg | Freq: Once | INTRAVENOUS | Status: AC
  Administered 2021-01-08: 16:00:00 1500 mg via INTRAVENOUS
  Filled 2021-01-08: qty 100

## 2021-01-08 MED ORDER — MAGNESIUM SULFATE 2 GM/50ML IV SOLN
2.0000 g | Freq: Once | INTRAVENOUS | Status: AC
  Administered 2021-01-09: 2 g via INTRAVENOUS
  Filled 2021-01-08: qty 50

## 2021-01-08 MED ORDER — POTASSIUM CHLORIDE CRYS ER 20 MEQ PO TBCR: 40.0000 meq | EXTENDED_RELEASE_TABLET | Freq: Once | ORAL | Status: DC

## 2021-01-08 MED ORDER — AMIODARONE HCL IN DEXTROSE 360-4.14 MG/200ML-% IV SOLN
60.0000 mg/h | INTRAVENOUS | Status: AC
  Administered 2021-01-08: 18:00:00 60 mg/h via INTRAVENOUS
  Filled 2021-01-08: qty 200

## 2021-01-08 NOTE — Progress Notes (Signed)
Amiodarone Drug - Drug Interaction Consult Note  Recommendations: No changes required   Amiodarone is metabolized by the cytochrome P450 system and therefore has the potential to cause many drug interactions. Amiodarone has an average plasma half-life of 50 days (range 20 to 100 days).   There is potential for drug interactions to occur several weeks or months after stopping treatment and the onset of drug interactions may be slow after initiating amiodarone.   []  Statins: Increased risk of myopathy. Simvastatin- restrict dose to 20mg  daily. Other statins: counsel patients to report any muscle pain or weakness immediately.  []  Anticoagulants: Amiodarone can increase anticoagulant effect. Consider warfarin dose reduction. Patients should be monitored closely and the dose of anticoagulant altered accordingly, remembering that amiodarone levels take several weeks to stabilize.  []  Antiepileptics: Amiodarone can increase plasma concentration of phenytoin, the dose should be reduced. Note that small changes in phenytoin dose can result in large changes in levels. Monitor patient and counsel on signs of toxicity.  []  Beta blockers: increased risk of bradycardia, AV block and myocardial depression. Sotalol - avoid concomitant use.  []   Calcium channel blockers (diltiazem and verapamil): increased risk of bradycardia, AV block and myocardial depression.  []   Cyclosporine: Amiodarone increases levels of cyclosporine. Reduced dose of cyclosporine is recommended.  []  Digoxin dose should be halved when amiodarone is started.  []  Diuretics: increased risk of cardiotoxicity if hypokalemia occurs.  []  Oral hypoglycemic agents (glyburide, glipizide, glimepiride): increased risk of hypoglycemia. Patient's glucose levels should be monitored closely when initiating amiodarone therapy.   []  Drugs that prolong the QT interval:  Torsades de pointes risk may be increased with concurrent use - avoid if possible.   Monitor QTc, also keep magnesium/potassium WNL if concurrent therapy can't be avoided.  Antibiotics: e.g. fluoroquinolones, erythromycin.  Antiarrhythmics: e.g. quinidine, procainamide, disopyramide, sotalol.  Antipsychotics: e.g. phenothiazines, haloperidol.   Lithium, tricyclic antidepressants, and methadone.  Thank You, PharmD, BCPS Clinical Pharmacist [facility] main pharmacy [phone] 01/08/2021 5:36 PM

## 2021-01-08 NOTE — Discharge Instructions (Addendum)
Post procedure care instructions (neck, groins) Keep procedure site clean & dry. If you notice increased pain, swelling, bleeding or pus, call/return!  No soaking in baths/hot tubs/pools for 1 week. PLEASE also note restrictions below NO showers until cleared to at your wound check visit You can ambulate normally though no exercising, long periods of walking for 1 week, no vigorous activities, please follow below as well      Supplemental Discharge Instructions for  Pacemaker/Defibrillator Patients  Activity No heavy lifting or vigorous activity with your left/right arm for 6 to 8 weeks.  Do not raise your left/right arm above your head for one week.  Gradually raise your affected arm as drawn below.             01/17/21                  01/18/21                   01/19/21               01/20/21 __  NO DRIVING for  6 months  WOUND CARE Keep the wound area clean and dry.  Do not get this area wet , no showers until cleared to at your wound check visit. The tape/steri-strips on your wound will fall off; do not pull them off.  No bandage is needed on the site.  DO  NOT apply any creams, oils, or ointments to the wound area. If you notice any drainage or discharge from the wound, any swelling or bruising at the site, or you develop a fever > 101? F after you are discharged home, call the office at once.  Special Instructions You are still able to use cellular telephones; use the ear opposite the side where you have your pacemaker/defibrillator.  Avoid carrying your cellular phone near your device. When traveling through airports, show security personnel your identification card to avoid being screened in the metal detectors.  Ask the security personnel to use the hand wand. Avoid arc welding equipment, MRI testing (magnetic resonance imaging), TENS units (transcutaneous nerve stimulators).  Call the office for questions about other devices. Avoid electrical appliances that are in poor  condition or are not properly grounded. Microwave ovens are safe to be near or to operate.  Additional information for defibrillator patients should your device go off: If your device goes off ONCE and you feel fine afterward, notify the device clinic nurses. If your device goes off ONCE and you do not feel well afterward, call 911. If your device goes off TWICE, call 911. If your device goes off THREE times in one day, call 911.  DO NOT DRIVE YOURSELF OR A FAMILY MEMBER WITH A DEFIBRILLATOR TO THE HOSPITAL--CALL 911.       1) Do not drive, swim, take baths, stand on elevated structures, operate heavy machinery, or do any other activities that would be dangerous if you had another seizure.  2) You need to follow up with a primary care doctor for your blood pressure issues.  3)STOP drinking alcohol as this is a major trigger for seizures   Department of Motor Vehicle (DMV) of [place] regulations for seizures - It is the patient's responsibility to report the incidence of the seizure in the state of [place]. [place] has no statutory provision requiring physicians to report patients diagnosed with epilepsy or seizures to a central state agency.  The recommended DMV regulation requirement for a driver in [place] for  an individual with a seizure is that they be seizure-free for 6-12 months. However, the DMV may consider the following exceptions to this general rule where: (1) a physician-directed change in medication causes a seizure and the individual immediately resumes the previous therapy which controlled seizures; (2) there is a history of nocturnal seizures or seizures which do not involve loss of consciousness, loss of control of motor function, or loss of appropriate sensation and information process; and (3) an individual has a seizure disorder preceded by an aura (warning) lasting 2-3 minutes. While the [facility] may also give consideration to other unusual circumstances which may affect the general  requirement that drivers be seizure-free for 6-12 months, interpretation of these circumstances and assignment of restrictions is at the discretion of the Medical Advisor. The DMV also considers compliance with medical therapy essential for safe driving. [facility] Physician's Guide to [employer] (June, 1995 ed.)] The Department learns of an individual's condition by inquiring on the application form or renewal form, a physician's report to the [facility], an accident report or from correspondence from the individual. The person may be required to submit a Medical Report Form either annually or semi-annually.    Contact a health care provider if: You have another seizure. You have seizures more often. Your seizure symptoms change. You continue to have seizures with treatment. You have symptoms of an infection or illness. They might increase your risk of having a seizure. Get help right away if: You have a seizure: That lasts longer than 5 minutes. That is different than previous seizures. That leaves you unable to speak or use a part of your body. That makes it harder to breathe. After a head injury. You have: Multiple seizures in a row. Confusion or a severe headache right after a seizure. You are having seizures more often. You do not wake up immediately after a seizure. You injure yourself during a seizure.

## 2021-01-08 NOTE — ED Notes (Signed)
Pt did bite her tongue.  Pt reports no BP meds x 2 years.

## 2021-01-08 NOTE — ED Provider Notes (Signed)
[facility]-EMERGENCY DEPT Provider Note   CSN: [id number] Arrival date & time: 01/08/21  1243     History Chief Complaint  Patient presents with   Seizures    Sharon Hanson is a 40 y.o. female with no significant past medical history who presents the emergency department for seizure.  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  She had approximately 3 minutes of tonic-clonic activity.  Patient did bite her tongue.  She did not lose control of her bladder or bowel.  She states that earlier in the day she noticed some numbness in her right hand but felt like it had resolved before she had the seizure.  She denies recent headaches, vision change.  She denies aura prior to the seizure.  She states that she did have 4 shots of liquor last night and that this is a frequent occurrence for her.  She does not take any medications and denies any new over-the-counter meds.  She complains of pain at the left occiput.   Seizures     No past medical history on file.  There are no problems to display for this patient.    OB History   No obstetric history on file.     No family history on file.     Home Medications Prior to Admission medications   Not on File    Allergies    Patient has no known allergies.  Review of Systems   Review of Systems  Allergic/Immunologic: Food allergies: .10ros.  Neurological:  Positive for seizures.   Physical Exam Updated Vital Signs BP (!) 166/109 (BP Location: Right Arm)   Pulse 98   Temp 98.1 F (36.7 C) (Oral)   Resp 20   SpO2 97%   Physical Exam Vitals and nursing note reviewed.  Constitutional:      General: She is not in acute distress.    Appearance: She is well-developed. She is not diaphoretic.  HENT:     Head: Normocephalic.     Comments: Small hematoma of the left occiput    Right Ear: External ear normal.     Left Ear:  External ear normal.     Nose: Nose normal.     Mouth/Throat:     Mouth: Mucous membranes are moist.     Comments: Small bite marks to the distal tip of the tongue no lacerations, no bleeding Eyes:     General: No scleral icterus.    Extraocular Movements: Extraocular movements intact.     Conjunctiva/sclera: Conjunctivae normal.     Pupils: Pupils are equal, round, and reactive to light.  Cardiovascular:     Rate and Rhythm: Normal rate and regular rhythm.     Heart sounds: Normal heart sounds. No murmur heard.   No friction rub. No gallop.  Pulmonary:     Effort: Pulmonary effort is normal. No respiratory distress.     Breath sounds: Normal breath sounds.  Abdominal:     General: Bowel sounds are normal. There is no distension.     Palpations: Abdomen is soft. There is no mass.     Tenderness: There is no abdominal tenderness. There is no guarding.  Musculoskeletal:     Cervical back: Normal range of motion.  Skin:    General: Skin is warm and dry.  Neurological:     Mental Status: She is alert and oriented to person, place,  and time.     GCS: GCS eye subscore is 4. GCS verbal subscore is 5. GCS motor subscore is 6.     Cranial Nerves: Cranial nerves 2-12 are intact.     Sensory: Sensation is intact.     Motor: Motor function is intact.     Coordination: Coordination is intact.     Gait: Gait is intact.  Psychiatric:        Behavior: Behavior normal.    ED Results / Procedures / Treatments   Labs (all labs ordered are listed, but only abnormal results are displayed) Labs Reviewed - No data to display  EKG None  Radiology No results found.  Procedures .Critical Care Performed by: Margarita Mail, PA-C Authorized by: Margarita Mail, PA-C   Critical care provider statement:    Critical care time (minutes):  30   Critical care time was exclusive of:  Separately billable procedures and treating other patients   Critical care was necessary to treat or prevent  imminent or life-threatening deterioration of the following conditions:  Cardiac failure and CNS failure or compromise   Critical care was time spent personally by me on the following activities:  Development of treatment plan with patient or surrogate, discussions with consultants, evaluation of patient's response to treatment, examination of patient, ordering and review of laboratory studies, ordering and review of radiographic studies, ordering and performing treatments and interventions, pulse oximetry, re-evaluation of patient's condition and review of old charts   Medications Ordered in ED Medications - No data to display  ED Course  I have reviewed the triage vital signs and the nursing notes.  Pertinent labs & imaging results that were available during my care of the patient were reviewed by me and considered in my medical decision making (see chart for details).  40 year old female who presents with new onset seizures. The differential diagnosis for includes but is not limited to idiopathic seizure, traumatic brain injury, intracranial hemorrhage, vascular lesion, mass or space containing lesion, degenerative neurologic disease, congenital brain abnormality, infectious etiology such as meningitis, encephalitis or abscess, metabolic disturbance including hyper or hypoglycemia, hyper or hyponatremia, hyperosmolar state, uremia, hepatic failure, hypocalcemia, hypomagnesemia.  Toxic substances such as cocaine, lidocaine, antidepressants, theophylline, alcohol withdrawal, drug withdrawal, eclampsia, hypertensive encephalopathy and anoxic brain injury.  I have ordered and reviewed labs that included CBC without significant abnormality.  CMP shows mild hypokalemia, hyponatremia, hypocalcemia all of insignificant value.  Patient's AST and ALT are elevated.  Creatinine just above normal.  I reviewed the patient's magnesium level which is normal and she has a negative hCG..  At shift change patient  was taken to the CT scanner where she proceeded to have a 2-minute tonic-clonic seizure.  She had tongue biting.  Patient was given 2 mg of IV Ativan.  She had resolution of seizure-like activity.  She was moved into a room where she was found to be in SVT at a rate of 224.  Patient was given a single dose of adenosine at bedside followed by a dose of 5 mg of metoprolol.  (Please see documentation by Dr. Alvino Chapel)  Patient seen in bedside consult by Dr. Kathrynn Speed who recommends continuing with ordered loading dose of Keppra 1500 mg.  He will place her on Keppra 500 mg twice daily.  He recommends MRI and EEG along with admission.  Signout given to PA Rona Ravens at shift handoff.  Patient is currently stabilized.   MDM Rules/Calculators/A&P  Final Clinical Impression(s) / ED Diagnoses Final  diagnoses:  Seizure ([place])  Elevated blood pressure reading    Rx / DC Orders ED Discharge Orders     None        Margarita Mail, PA-C 01/08/21 1530    Davonna Belling, MD 01/09/21 0005

## 2021-01-08 NOTE — ED Triage Notes (Signed)
Pt bib [facility] from work d/t seizure.  Per EMS report pt began having seizure activity then fell.  Pt did hit her head has hematoma.  Seizure activity witnessed by Cabin crew.  Co worker stated pt was lethargic immediately after seizure activity.  Hx of HTN non compliant. Pt does not have hx of seizures.

## 2021-01-08 NOTE — Consult Note (Signed)
Neurology Consultation Reason for Consult: Seizures Referring Physician: Robb Matar, D  CC: Seizures  History is obtained from: Chart review  HPI: Sharon Hanson is a 40 y.o. female who reports a history of a seizure as a child, who presented with seizure.  While in the emergency department, she had a second tonic-clonic seizure while in the CT scanner.  She also had V. tach after arriving in the hospital.  Is unclear to me which came first.  As I examined the patient, she was starting to come around, still very lethargic but able to answer some questions.    ROS:  Unable to obtain due to altered mental status.   Past Medical History:  Diagnosis Date   Hypertension      Family History  Problem Relation Age of Onset   Hypertension Other      Social History:  reports that she has quit smoking. Her smoking use included cigars. She has never used smokeless tobacco. She reports current alcohol use. She reports that she does not use drugs.   Exam: Current vital signs: BP 140/87 (BP Location: Left Arm)   Pulse 99   Temp 99.5 F (37.5 C) (Oral)   Resp 20   Ht 5\' 7"  (1.702 m)   Wt 111.1 kg   LMP 12/07/2020 (Approximate)   SpO2 98%   BMI 38.37 kg/m  Vital signs in last 24 hours: Temp:  [98 F (36.7 C)-99.5 F (37.5 C)] 99.5 F (37.5 C) (11/17 1924) Pulse Rate:  [96-111] 99 (11/17 1924) Resp:  [20-25] 20 (11/17 1924) BP: (140-166)/(87-126) 140/87 (11/17 1924) SpO2:  [93 %-100 %] 98 % (11/17 1924) Weight:  [111.1 kg] 111.1 kg (11/17 1535)   Physical Exam  Constitutional: Appears well-developed and well-nourished.  Psych: Too lethargic to answer many questions Eyes: No scleral injection HENT: No OP obstruction MSK: no joint deformities.  Cardiovascular: Normal rate and regular rhythm.  Respiratory: Effort normal, non-labored breathing GI: Soft.  No distension. There is no tenderness.  Skin: WDI  Neuro: Mental Status: Patient is lethargic but she is able to answer  a few questions including to tell me that she had a seizure as a child. Cranial Nerves: II: Visual Fields are full. Pupils are equal, round, and reactive to light.   III,IV, VI: EOMI without ptosis or diploplia.  V: Facial sensation is symmetric to temperature VII: Facial movement is symmetric.  Motor: She moves all extremities to command, does not give good effort to formal testing due to lethargy Sensory: She response to noxious simulation x4 Deep Tendon Reflexes: 2+ and symmetric in the biceps and patellae.  Nonsustained clonus at the ankles Cerebellar: Does not perform   I have reviewed labs in epic and the results pertinent to this consultation are: Hyponatremia with a sodium of 133 Creatinine 1.1 Calcium 8.2 Magnesium 2.2  I have reviewed the images obtained: CT head is normal  Impression: 40 year old female with new onset seizures.  It will be helpful to get more history when she is more awake about her previous seizure history.  For now, I would favor continuing antiepileptic therapy with Keppra and obtaining an MRI and an EEG.  Recommendations: 1) MRI brain 2) EEG 3) Keppra 500 mg twice daily 4) neurology will continue to follow   24, MD [facility] [phone]  If 7pm- 7am, please page neurology on call as listed in [facility].

## 2021-01-08 NOTE — ED Provider Notes (Signed)
Received signout at the beginning of shift, please see previous providers notes for complete H&P.  This is a 40 year old female without significant history of seizure brought here via EMS due to having a seizure activity while at work earlier today.  She developed a second witnessed tonic-clonic seizure while in the CT scanner for head CT scan.  Seizure lasting for approximately 2 minutes, followed by postictal state, and tongue biting.  Patient was found to be tachycardic with heart rates in the 240s, was given 2mg  of ativan and 5 mg of metoprolol.  Neurologist, Dr. to see and evaluate patient and agrees with Keppra loading dose.  Recommend admission for brain MRI and EEG.  Cardiology, Dr. Amada Jupiter have been consulted and recommend starting an amiodarone bolus and drip with the assumption that pt's tachycardiac could be Vtach.  Recommend medicine admission and transfer to [facility].    4:50 PM Appreciate consultation from [facility] who agrees to see pt and will help with the transfer to [facility] for admission.     time spent personally by me on the following activities: Review prior charts, imaging, and labs.  Development of treatment plan with patient and/or surrogate as well as nursing, discussions with consultants, evaluation of patient's response to treatment, examination of patient, obtaining history from patient or surrogate, ordering and performing treatments and interventions, ordering and review of laboratory studies, ordering and review of radiographic studies, pulse oximetry and re-evaluation of patient's condition.  .Critical Care Performed by: [facility], PA-C Authorized by: Fayrene Helper, PA-C   Critical care provider statement:    Critical care time (minutes):  45   Critical care was necessary to treat or prevent imminent or life-threatening deterioration of the following conditions:  CNS failure or compromise and cardiac failure   Critical care was time  spent personally by me on the following activities:  Development of treatment plan with patient or surrogate, discussions with consultants, evaluation of patient's response to treatment, examination of patient, ordering and review of laboratory studies, ordering and review of radiographic studies, ordering and performing treatments and interventions, pulse oximetry, re-evaluation of patient's condition and review of old charts   I assumed direction of critical care for this patient from another provider in my specialty: yes     Care discussed with: admitting provider     BP (!) 151/126   Pulse 98   Temp 98 F (36.7 C) (Oral)   Resp (!) 21   Ht 5\' 7"  (1.702 m)   Wt 111.1 kg   LMP 12/07/2020 (Approximate)   SpO2 93%   BMI 38.37 kg/m   Results for orders placed or performed during the hospital encounter of 01/08/21  Resp Panel by RT-PCR (Flu A&B, Covid) Nasopharyngeal Swab   Specimen: Nasopharyngeal Swab; Nasopharyngeal(NP) swabs in vial transport medium  Result Value Ref Range   SARS Coronavirus 2 by RT PCR NEGATIVE NEGATIVE   Influenza A by PCR NEGATIVE NEGATIVE   Influenza B by PCR NEGATIVE NEGATIVE  Comprehensive metabolic panel  Result Value Ref Range   Sodium 133 (L) 135 - 145 mmol/L   Potassium 3.4 (L) 3.5 - 5.1 mmol/L   Chloride 99 98 - 111 mmol/L   CO2 21 (L) 22 - 32 mmol/L   Glucose, Bld 111 (H) 70 - 99 mg/dL   BUN 13 6 - 20 mg/dL   Creatinine, Ser 12/09/2020 (H) 0.44 - 1.00 mg/dL   Calcium 8.2 (L) 8.9 - 10.3 mg/dL   Total Protein  7.8 6.5 - 8.1 g/dL   Albumin 4.1 3.5 - 5.0 g/dL   AST 93 (H) 15 - 41 U/L   ALT 67 (H) 0 - 44 U/L   Alkaline Phosphatase 99 38 - 126 U/L   Total Bilirubin 1.2 0.3 - 1.2 mg/dL   GFR, Estimated >11 >17 mL/min   Anion gap 13 5 - 15  CBC with Differential/Platelet  Result Value Ref Range   WBC 8.3 4.0 - 10.5 K/uL   RBC 4.73 3.87 - 5.11 MIL/uL   Hemoglobin 12.5 12.0 - 15.0 g/dL   HCT 35.6 70.1 - 41.0 %   MCV 84.1 80.0 - 100.0 fL   MCH 26.4 26.0 -  34.0 pg   MCHC 31.4 30.0 - 36.0 g/dL   RDW 30.1 (H) 31.4 - 38.8 %   Platelets 350 150 - 400 K/uL   nRBC 0.0 0.0 - 0.2 %   Neutrophils Relative % PENDING %   Neutro Abs PENDING 1.7 - 7.7 K/uL   Band Neutrophils PENDING %   Lymphocytes Relative PENDING %   Lymphs Abs PENDING 0.7 - 4.0 K/uL   Monocytes Relative PENDING %   Monocytes Absolute PENDING 0.1 - 1.0 K/uL   Eosinophils Relative PENDING %   Eosinophils Absolute PENDING 0.0 - 0.5 K/uL   Basophils Relative PENDING %   Basophils Absolute PENDING 0.0 - 0.1 K/uL   WBC Morphology PENDING    RBC Morphology PENDING    Smear Review PENDING    Other PENDING %   nRBC PENDING 0 /100 WBC   Metamyelocytes Relative PENDING %   Myelocytes PENDING %   Promyelocytes Relative PENDING %   Blasts PENDING %   Immature Granulocytes PENDING %   Abs Immature Granulocytes PENDING 0.00 - 0.07 K/uL  Magnesium  Result Value Ref Range   Magnesium 2.2 1.7 - 2.4 mg/dL  I-Stat beta hCG blood, ED  Result Value Ref Range   I-stat hCG, quantitative <5.0 <5 mIU/mL   Comment 3           No results found.     Fayrene Helper, PA-C 01/08/21 1652    Benjiman Core, MD 01/09/21 1455

## 2021-01-08 NOTE — ED Notes (Signed)
Pt to MRI at this time.

## 2021-01-08 NOTE — Progress Notes (Addendum)
HOSPITAL MEDICINE OVERNIGHT EVENT NOTE    Notified by admitting provider at [facility] as well as bedside nurse at [facility] the patient is arrived on the medical floor on 3 W.  Unfortunately shortly after arrival, patient stumbled and landed on her right knee upon trying to walk to the bathroom.  Patient is now complaining of knee pain with no visible injury.  Will obtain right knee x-ray.  After discussion with the admitting provider and review of the chart, I have paged and discussed the patient with Dr. Welton Flakes with cardiology to discuss what seems to be ventricular tachycardia that occurred in the [facility] emergency department.  Dr. Welton Flakes recommends continued amiodarone infusion overnight, monitoring on telemetry and replacement of electrolytes as necessary.  Dr. Welton Flakes will ensure that electrophysiology is notified of the patient in the morning for consultation.  Marinda Elk  MD [facility]   ADDENDUM (11/18 12:50AM)  Potassium noted to be 3.3 on repeat chemistry with magnesium of 1.9.  Patient still receiving a remaining portion of intravenous potassium chloride previously ordered.  We will additionally provide patient with 40 millequivalents of oral potassium chloride.  For the Mg 1.9, intravenous magnesium sulfate was ordered earlier in the evening but has yet to be given.  Nursing is to give this shortly.  Target is to achieve target potassium of 4.0 and target magnesium of 2.0.  Deno Lunger Richardo Popoff

## 2021-01-08 NOTE — H&P (Signed)
History and Physical    Sharon Hanson OMV:[id number] DOB: 1981/01/24 DOA: 01/08/2021  PCP: Pcp, No   Patient coming from: Home.   I have personally briefly reviewed patient's old medical records in [facility]  Chief Complaint: Seizures.  HPI: Sharon Hanson is a 40 y.o. female with medical history significant of hypertension, class II obesity who was brought to the emergency department via EMS after having a seizure while at work earlier in the day.  She had a second tonic-clonic seizure while she was in the CT scanner.  There was no fecal or urinary incontinence.  She still mildly sedated and does not remember much of what happened.  She denied any prodromal symptoms, headache, blurred vision, nausea, dizziness or gait imbalance.  No fever, rhinorrhea, sore throat, wheezing or hemoptysis.  No chest pain, diaphoresis, PND, orthopnea or pitting edema of the lower extremities.  She gets frequent palpitations associated with dyspnea, sometimes twice a day.  These usually resolve with rest for about 10 minutes.  No abdominal pain, nausea, emesis, diarrhea, constipation, melena or hematochezia.  No flank pain, dysuria, frequency or hematuria.  No polyuria, polydipsia, polyphagia or blurred vision.  ED Course: Initial vital signs were temperature  98.1 F, pulse 98, respirations 20, BP 166/109 mmHg and O2 sat 97% on room air.  The patient then was loaded with 1500 mg of Keppra IVPB and was started on an amiodarone infusion.  Lab work: Her CBC showed a white count of 8.3, hemoglobin 12.5 g/dL platelets 628.  Differential still pending.  Magnesium was 2.2 mg/dL.  Sodium 133, potassium 3.4, chloride 99 and CO2 21 mmol/L.  Glucose 111, creatinine 1.10 and calcium 8.2 mg/dL.  AST was 93 ALT 67 units/L.  The rest of the CMP was unremarkable.  Imaging: Portable chest radiograph did not have any acute findings.  CT head was normal.  Please see images and full radiology report for further  details.  Review of Systems: As per HPI otherwise all other systems reviewed and are negative.  Past Medical History:  Diagnosis Date   Hypertension    Past Surgical History:  Procedure Laterality Date   ANKLE SURGERY Right    Social History  reports that she has quit smoking. Her smoking use included cigars. She has never used smokeless tobacco. She reports current alcohol use. She reports that she does not use drugs.  No Known Allergies  Family History  Problem Relation Age of Onset   Hypertension Other    Prior to Admission medications   Not on File   Physical Exam: Vitals:   01/08/21 1615 01/08/21 1630 01/08/21 1645 01/08/21 1700  BP: (!) 154/109  (!) 151/126 (!) 160/114  Pulse: (!) 111 (!) 104 98 96  Resp: (!) 25 (!) 21 (!) 21 (!) 24  Temp:      TempSrc:      SpO2: 98% 100% 93% 98%  Weight:      Height:       Constitutional: NAD, calm, comfortable Eyes: PERRL, lids and conjunctivae normal ENMT: Mucous membranes are moist.  Tongue shows self bite injury.  Posterior pharynx clear of any exudate or lesions. Neck: Normal, supple, no masses, no thyromegaly Respiratory: clear to auscultation bilaterally, no wheezing, no crackles. Normal respiratory effort. No accessory muscle use.  Cardiovascular: Sinus tachycardia in the low 100s, regular rate and rhythm, no murmurs / rubs / gallops. No extremity edema. 2+ pedal pulses. No carotid bruits.  Abdomen: Obese, no distention.  Bowel sounds  positive.  Soft, no tenderness, no masses palpated. No hepatosplenomegaly. Musculoskeletal: Mild generalized weakness.  No clubbing / cyanosis.  Believes good ROM, no contractures. Normal muscle tone.  Skin: no acute rashes, lesions, ulcers on very limited examination. Neurologic: CN 2-12 grossly intact. Sensation intact, DTR normal. Strength 5/5 in all 4.  Psychiatric: Mildly somnolent but alert and oriented x 3. Normal mood.   Labs on Admission: I have personally reviewed following labs  and imaging studies  CBC: Recent Labs  Lab 01/08/21 1355  WBC 8.3  NEUTROABS PENDING  HGB 12.5  HCT 39.8  MCV 84.1  PLT [id number]    Basic Metabolic Panel: Recent Labs  Lab 01/08/21 1355  NA 133*  K 3.4*  CL 99  CO2 21*  GLUCOSE 111*  BUN 13  CREATININE 1.10*  CALCIUM 8.2*  MG 2.2    GFR: Estimated Creatinine Clearance: 88.2 mL/min (A) (by C-G formula based on SCr of 1.1 mg/dL (H)).  Liver Function Tests: Recent Labs  Lab 01/08/21 1355  AST 93*  ALT 67*  ALKPHOS 99  BILITOT 1.2  PROT 7.8  ALBUMIN 4.1   Radiological Exams on Admission: CT Head Wo Contrast  Result Date: 01/08/2021 CLINICAL DATA:  Seizure, nontraumatic (Age 66-40y) EXAM: CT HEAD WITHOUT CONTRAST TECHNIQUE: Contiguous axial images were obtained from the base of the skull through the vertex without intravenous contrast. COMPARISON:  None. FINDINGS: Brain: No acute intracranial abnormality. Specifically, no hemorrhage, hydrocephalus, mass lesion, acute infarction, or significant intracranial injury. Vascular: No hyperdense vessel or unexpected calcification. Skull: No acute calvarial abnormality. Sinuses/Orbits: No acute findings Other: None IMPRESSION: Normal study. Electronically Signed   By: Rolm Baptise M.D.   On: 01/08/2021 17:20   DG Chest Portable 1 View  Result Date: 01/08/2021 CLINICAL DATA:  Syncope and seizure. EXAM: PORTABLE CHEST 1 VIEW COMPARISON:  Radiograph 10/06/2020 FINDINGS: The cardiomediastinal contours are normal. Stable prominence of the right and left paratracheal stripe. Unchanged mild elevation of right hemidiaphragm. Pulmonary vasculature is normal. No consolidation, pleural effusion, or pneumothorax. No acute osseous abnormalities are seen. IMPRESSION: No acute chest findings. Electronically Signed   By: Keith Rake M.D.   On: 01/08/2021 17:42    EKG: Independently reviewed.  Vent. 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 Continue Keppra 500 mg IVPB every 12 hours. Lorazepam as needed for seizure activity. Check electroencephalogram. Check MRI of the brain.  Active Problems:   SVT vs wide-complex tachycardia Continue amiodarone infusion. Optimize electrolytes. Check echocardiogram. Cardiology will evaluate.    Hypertension Begin metoprolol 25 mg p.o. twice daily. Monitor blood pressure and heart rate.    Hyponatremia   Hypokalemia KCl 40 mEq p.o. x1 dose now. Normal saline plus KCl 40 mEq 1000 mL over 10 hours. Follow-up sodium and potassium level in the morning.    Hypocalcemia Recheck calcium and albumin level. Further work-up depending on result.    Transaminitis Follow-up transaminases in the morning.    Class 2 obesity High risk for complications.   Follow-up with primary care provider.   DVT prophylaxis: Lovenox SQ. Code Status:   Full code. Family Communication:   Disposition Plan:   Patient is from:  Home.  Anticipated DC to:  Home.  Anticipated DC date:  01/09/2021 or 01/10/2021.  Anticipated DC barriers: Clinical status.  Consults called:  Neurology (  Dr. Leonel Ramsay) cardiology (Dr. Johney Frame). Admission status:  Observation/PCU.   Severity of Illness: High severity after presenting with  episodes of seizures and developing tachyarrhythmia with a heart rate in the 220s in the emergency department.  The patient will be admitted for close monitoring, further work-up and treatment  Reubin Milan MD [facility]  How to contact the [facility] Attending or Consulting provider [place] or covering provider during after hours [place], for this patient?   Check the care team in [facility] and look for a) attending/consulting [facility] provider listed and b) the [facility] team listed Log into [email or web address] and use [place]'s  universal password to access. If you do not have the password, please contact the hospital operator. Locate the [facility] provider you are looking for under [facility] and page to a number that you can be directly reached. If you still have difficulty reaching the provider, please page the [facility] (Director on Call) for the Hospitalists listed on amion for assistance.  01/08/2021, 6:04 PM   This document was prepared using Dragon voice recognition software and may contain some unintended transcription errors.

## 2021-01-08 NOTE — ED Notes (Signed)
[facility] notified of transport need

## 2021-01-09 ENCOUNTER — Observation Stay (HOSPITAL_COMMUNITY): Payer: Self-pay

## 2021-01-09 DIAGNOSIS — R9431 Abnormal electrocardiogram [ECG] [EKG]: Secondary | ICD-10-CM

## 2021-01-09 DIAGNOSIS — R569 Unspecified convulsions: Secondary | ICD-10-CM

## 2021-01-09 DIAGNOSIS — I472 Ventricular tachycardia, unspecified: Principal | ICD-10-CM

## 2021-01-09 LAB — BASIC METABOLIC PANEL
Anion gap: 7 (ref 5–15)
BUN: 6 mg/dL (ref 6–20)
CO2: 23 mmol/L (ref 22–32)
Calcium: 7.9 mg/dL — ABNORMAL LOW (ref 8.9–10.3)
Chloride: 105 mmol/L (ref 98–111)
Creatinine, Ser: 1.1 mg/dL — ABNORMAL HIGH (ref 0.44–1.00)
GFR, Estimated: >60 mL/min (ref >60)
Glucose, Bld: 104 mg/dL — ABNORMAL HIGH (ref 70–99)
Potassium: 3.2 mmol/L — ABNORMAL LOW (ref 3.5–5.1)
Sodium: 135 mmol/L (ref 135–145)

## 2021-01-09 LAB — HEPATIC FUNCTION PANEL
ALT: 60 U/L — ABNORMAL HIGH (ref 0–44)
AST: 104 U/L — ABNORMAL HIGH (ref 15–41)
Albumin: 3.1 g/dL — ABNORMAL LOW (ref 3.5–5.0)
Alkaline Phosphatase: 81 U/L (ref 38–126)
Bilirubin, Direct: 0.3 mg/dL — ABNORMAL HIGH (ref 0.0–0.2)
Indirect Bilirubin: 1.4 mg/dL — ABNORMAL HIGH (ref 0.3–0.9)
Total Bilirubin: 1.7 mg/dL — ABNORMAL HIGH (ref 0.3–1.2)
Total Protein: 6.3 g/dL — ABNORMAL LOW (ref 6.5–8.1)

## 2021-01-09 LAB — MAGNESIUM: Magnesium: 2.3 mg/dL (ref 1.7–2.4)

## 2021-01-09 LAB — HIV ANTIBODY (ROUTINE TESTING W REFLEX): HIV Screen 4th Generation wRfx: NONREACTIVE

## 2021-01-09 LAB — ECHOCARDIOGRAM COMPLETE
Area-P 1/2: 2.57 cm2
Height: 67 in
S' Lateral: 3.2 cm
Weight: 3920 oz

## 2021-01-09 MED ORDER — POTASSIUM CHLORIDE 20 MEQ PO PACK
40.0000 meq | PACK | ORAL | Status: AC
  Administered 2021-01-09 (×2): 40 meq via ORAL
  Filled 2021-01-09 (×2): qty 2

## 2021-01-09 MED ORDER — POTASSIUM CHLORIDE 20 MEQ PO PACK
40.0000 meq | PACK | Freq: Once | ORAL | Status: AC
  Administered 2021-01-09: 40 meq via ORAL
  Filled 2021-01-09: qty 2

## 2021-01-09 MED ORDER — ENSURE MAX PROTEIN PO LIQD
11.0000 [oz_av] | Freq: Two times a day (BID) | ORAL | Status: DC
  Administered 2021-01-09 – 2021-01-12 (×7): 11 [oz_av] via ORAL
  Filled 2021-01-09 (×11): qty 330

## 2021-01-09 NOTE — Progress Notes (Signed)
Echocardiogram 2D Echocardiogram has been performed.  Warren Lacy [place] Chelf RDCS 01/09/2021, 3:02 PM

## 2021-01-09 NOTE — Progress Notes (Signed)
PROGRESS NOTE    Sharon Hanson  NID:[id number] DOB: 02/17/1981 DOA: 01/08/2021 PCP: Pcp, No   Chief Complaint  Patient presents with   Seizures   Brief Narrative/Hospital Course:  [name], 40 y.o. female with PMH of hypertension obesity episode described as strong diet likely seizure did not have any work-up for doctor visit brought to the ED after having seizure episode at work. In the ED while getting CT scan set on the episode of G TCS was monosaturated did not remember the event.  She is admitted for further management.  She was given Keppra.  Following postictal state and tongue biting she was found to be tachycardic with heart rate into 240s-cardiology was also consulted concerned about  V. tach placed on amiodarone. Patient was transferred to [facility] and seen by neurology.  Subjective:  Seen this am No new complaints Resting well Patient's significant other is at the bedside  Assessment & Plan:  Seizures, generalized convulsive: MRI - partially empty sella. Pending EEG. Cont Keppra, seizure precaution fall precaution.  Neurology following closely.  Patient reported patient had episode of seizure disorder with tongue bite x1 likely in sleep in 2016.  Had no further work-up.  We discussed about no driving x6 months or until seizure-free and cleared by neurology.  SVT vs WCTs:Tachycardia heart rate in 240s in ED- cardiology consulted, echocardiogram, TSH/free T4 pending.Monitor in telemetry. Cont amiodarone infusion and metoprolol 25 twice daily.  Hypertension: Blood pressure stable.  Not on meds. Hyponatremia: Monitor labs Hypokalemia: We will replete.  Follow-up magnesium level Hypocalcemia-add po calcium supplementation. Transaminitis: Question fatty liver versus alcohol use related.  Monitor labs. Alcohol use: Discussed about cessation.  Monitor for signs of withdrawal  Class II Obesity:Patient's Body mass index is 38.37 kg/m. : Will benefit with PCP  follow-up, weight loss  healthy lifestyle and outpatient sleep evaluation.  DVT prophylaxis: enoxaparin (LOVENOX) injection 40 mg Start: 01/08/21 2200 Code Status:   Code Status: Full Code Family Communication: plan of care discussed with patient and significant other at bedside. Status is: Observation Remains hospitalized for ongoing management of seizure disorder with arrhythmia pending further input from cardiology and neurology. Disposition: Currently not medically stable for discharge. Anticipated Disposition: home   Objective: Vitals last 24 hrs: Vitals:   01/08/21 2042 01/08/21 2312 01/09/21 0357 01/09/21 0733  BP:  115/70 128/73 (!) 154/99  Pulse: 86 77 84 73  Resp:  18 18 18   Temp:  99 F (37.2 C) 98 F (36.7 C) 98.6 F (37 C)  TempSrc:  Oral Oral Oral  SpO2:  98% 99% 100%  Weight:      Height:       Weight change:  No intake or output data in the 24 hours ending 01/09/21 1044 Net IO Since Admission: No IO data has been entered for this period [01/09/21 1044]   Physical Examination: General exam: AA0x3, weak,older than stated age. HEENT:Oral mucosa moist, Ear/Nose WNL grossly,dentition normal. Respiratory system: B/l clear BS, no use of accessory muscle, non tender. Cardiovascular system: S1 & S2 +,No JVD. Gastrointestinal system: Abdomen soft, NT,ND, BS+. Nervous System:Alert, awake, moving extremities. Extremities: edema none, distal peripheral pulses palpable.  Skin: No rashes, no icterus. MSK: Normal muscle bulk, tone, power.  Medications reviewed:  Scheduled Meds:  enoxaparin (LOVENOX) injection  40 mg Subcutaneous Q24H   metoprolol tartrate  25 mg Oral BID   Continuous Infusions:  amiodarone 30 mg/hr (01/08/21 2317)   levETIRAcetam 500 mg (01/09/21 0559)  Diet Order             Diet Heart Room service appropriate? Yes; Fluid consistency: Thin  Diet effective now                   Weight change:   Wt Readings from Last 3 Encounters:   01/08/21 111.1 kg  10/06/20 117.9 kg     Consultants:see note  Procedures:see note Antimicrobials: Anti-infectives (From admission, onward)    None      Culture/Microbiology No results found for: SDES, SPECREQUEST, CULT, REPTSTATUS  Other culture-see note  Unresulted Labs (From admission, onward)     Start     Ordered   01/15/21 0500  Creatinine, serum  (enoxaparin (LOVENOX)    CrCl >/= 30 ml/min)  Weekly,   R     Comments: while on enoxaparin therapy    01/08/21 1802   01/09/21 0857  T4, free  Once,   R        01/09/21 0856   01/09/21 0857  Hepatic function panel  Add-on,   AD        01/09/21 0857   01/09/21 0856  TSH  Add-on,   AD        01/09/21 0856   01/08/21 1509  Rapid urine drug screen (hospital performed)  ONCE - STAT,   STAT        01/08/21 1508           Data Reviewed: I have personally reviewed following labs and imaging studies CBC: Recent Labs  Lab 01/08/21 1355  WBC 8.3  NEUTROABS 6.6  HGB 12.5  HCT 39.8  MCV 84.1  PLT 350   Basic Metabolic Panel: Recent Labs  Lab 01/08/21 1355 01/08/21 2238 01/09/21 0840  NA 133* 134* 135  K 3.4* 3.3* 3.2*  CL 99 101 105  CO2 21* 20* 23  GLUCOSE 111* 98 104*  BUN 13 7 6   CREATININE 1.10* 1.09* 1.10*  CALCIUM 8.2* 8.1* 7.9*  MG 2.2 1.9 2.3   GFR: Estimated Creatinine Clearance: 88.2 mL/min (A) (by C-G formula based on SCr of 1.1 mg/dL (H)). Liver Function Tests: Recent Labs  Lab 01/08/21 1355  AST 93*  ALT 67*  ALKPHOS 99  BILITOT 1.2  PROT 7.8  ALBUMIN 4.1   No results for input(s): LIPASE, AMYLASE in the last 168 hours. No results for input(s): AMMONIA in the last 168 hours. Coagulation Profile: No results for input(s): INR, PROTIME in the last 168 hours. Cardiac Enzymes: No results for input(s): CKTOTAL, CKMB, CKMBINDEX, TROPONINI in the last 168 hours. BNP (last 3 results) No results for input(s): PROBNP in the last 8760 hours. HbA1C: No results for input(s): HGBA1C in the  last 72 hours. CBG: No results for input(s): GLUCAP in the last 168 hours. Lipid Profile: No results for input(s): CHOL, HDL, LDLCALC, TRIG, CHOLHDL, LDLDIRECT in the last 72 hours. Thyroid Function Tests: No results for input(s): TSH, T4TOTAL, FREET4, T3FREE, THYROIDAB in the last 72 hours. Anemia Panel: No results for input(s): VITAMINB12, FOLATE, FERRITIN, TIBC, IRON, RETICCTPCT in the last 72 hours. Sepsis Labs: No results for input(s): PROCALCITON, LATICACIDVEN in the last 168 hours.  Recent Results (from the past 240 hour(s))  Resp Panel by RT-PCR (Flu A&B, Covid) Nasopharyngeal Swab     Status: None   Collection Time: 01/08/21  3:40 PM   Specimen: Nasopharyngeal Swab; Nasopharyngeal(NP) swabs in vial transport medium  Result Value Ref Range Status   SARS Coronavirus 2  by RT PCR NEGATIVE NEGATIVE Final    Comment: (NOTE) SARS-CoV-2 target nucleic acids are NOT DETECTED.  The SARS-CoV-2 RNA is generally detectable in upper respiratory specimens during the acute phase of infection. The lowest concentration of SARS-CoV-2 viral copies this assay can detect is 138 copies/mL. A negative result does not preclude SARS-Cov-2 infection and should not be used as the sole basis for treatment or other patient management decisions. A negative result may occur with  improper specimen collection/handling, submission of specimen other than nasopharyngeal swab, presence of viral mutation(s) within the areas targeted by this assay, and inadequate number of viral copies(<138 copies/mL). A negative result must be combined with clinical observations, patient history, and epidemiological information. The expected result is Negative.  Fact Sheet for Patients:  BloggerCourse.com  Fact Sheet for Healthcare Providers:  SeriousBroker.it  This test is no t yet approved or cleared by the [place] FDA and  has been authorized for detection and/or  diagnosis of SARS-CoV-2 by FDA under an Emergency Use Authorization (EUA). This EUA will remain  in effect (meaning this test can be used) for the duration of the COVID-19 declaration under Section 564(b)(1) of the Act, 21 U.S.C.section 360bbb-3(b)(1), unless the authorization is terminated  or revoked sooner.       Influenza A by PCR NEGATIVE NEGATIVE Final   Influenza B by PCR NEGATIVE NEGATIVE Final    Comment: (NOTE) The Xpert Xpress SARS-CoV-2/FLU/RSV plus assay is intended as an aid in the diagnosis of influenza from Nasopharyngeal swab specimens and should not be used as a sole basis for treatment. Nasal washings and aspirates are unacceptable for Xpert Xpress SARS-CoV-2/FLU/RSV testing.  Fact Sheet for Patients: BloggerCourse.com  Fact Sheet for Healthcare Providers: SeriousBroker.it  This test is not yet approved or cleared by the [place] FDA and has been authorized for detection and/or diagnosis of SARS-CoV-2 by FDA under an Emergency Use Authorization (EUA). This EUA will remain in effect (meaning this test can be used) for the duration of the COVID-19 declaration under Section 564(b)(1) of the Act, 21 U.S.C. section 360bbb-3(b)(1), unless the authorization is terminated or revoked.  Performed at [facility], [address], [place], [place] [address]      Radiology Studies: CT Head Wo Contrast  Result Date: 01/08/2021 CLINICAL DATA:  Seizure, nontraumatic (Age 39-40y) EXAM: CT HEAD WITHOUT CONTRAST TECHNIQUE: Contiguous axial images were obtained from the base of the skull through the vertex without intravenous contrast. COMPARISON:  None. FINDINGS: Brain: No acute intracranial abnormality. Specifically, no hemorrhage, hydrocephalus, mass lesion, acute infarction, or significant intracranial injury. Vascular: No hyperdense vessel or unexpected calcification. Skull: No acute calvarial  abnormality. Sinuses/Orbits: No acute findings Other: None IMPRESSION: Normal study. Electronically Signed   By: Charlett Nose M.D.   On: 01/08/2021 17:20   MR BRAIN WO CONTRAST  Result Date: 01/08/2021 CLINICAL DATA:  Seizure, nontraumatic (Age 43-40y) EXAM: MRI HEAD WITHOUT CONTRAST TECHNIQUE: Multiplanar, multiecho pulse sequences of the brain and surrounding structures were obtained without intravenous contrast. COMPARISON:  None. FINDINGS: Mildly motion study. Brain: No acute infarction, hemorrhage, hydrocephalus, extra-axial collection or mass lesion. The hippocampi are within normal limits and symmetric in size/signal. Partially empty and expanded sella. Vascular: Major arterial flow voids are maintained at the skull base. Skull and upper cervical spine: Normal marrow signal. Sinuses/Orbits: Clear sinuses.  Unremarkable orbits. Other: No mastoid effusions. IMPRESSION: 1. No evidence of acute abnormality. 2. Partially empty and expanded sella, which is often a normal anatomic  variant but can be associated with idiopathic intracranial hypertension. Electronically Signed   By: Feliberto Harts M.D.   On: 01/08/2021 18:36   DG Chest Portable 1 View  Result Date: 01/08/2021 CLINICAL DATA:  Syncope and seizure. EXAM: PORTABLE CHEST 1 VIEW COMPARISON:  Radiograph 10/06/2020 FINDINGS: The cardiomediastinal contours are normal. Stable prominence of the right and left paratracheal stripe. Unchanged mild elevation of right hemidiaphragm. Pulmonary vasculature is normal. No consolidation, pleural effusion, or pneumothorax. No acute osseous abnormalities are seen. IMPRESSION: No acute chest findings. Electronically Signed   By: Narda Rutherford M.D.   On: 01/08/2021 17:42   DG Knee Complete 4 Views Right  Result Date: 01/08/2021 CLINICAL DATA:  Fall, right knee pain. EXAM: RIGHT KNEE - COMPLETE 4+ VIEW COMPARISON:  None. FINDINGS: No evidence of fracture, dislocation, or joint effusion. Mild medial  tibiofemoral joint space narrowing. Trace patellofemoral spurring. There is a small quadriceps tendon enthesophyte. Soft tissues are unremarkable. IMPRESSION: 1. No fracture or dislocation. 2. Mild degenerative change. 3. Small quadriceps tendon enthesophyte. Electronically Signed   By: Narda Rutherford M.D.   On: 01/08/2021 22:36     LOS: 0 days   Lanae Boast, MD [facility]  01/09/2021, 10:44 AM

## 2021-01-09 NOTE — TOC Initial Note (Signed)
Transition of Care [facility]) - Initial/Assessment Note    Patient Details  Name: Sharon Hanson MRN: [id number] Date of Birth: Aug 29, 1980  Transition of Care [facility]) CM/SW Contact:    Kermit Balo, RN Phone Number: 01/09/2021, 11:11 AM  Clinical Narrative:                 Patient is from home with her girlfriend, [name]. [name] is with the patient when patient is not working.  Pt states she has insurance from [place] but has not been able to get it carried over to [place] as of yet.  No PCP in [place]. CM was able to obtain an appt with [facility]. No appts until Jan with any of the [facility]. CM has asked MD to please write for 2 months of meds at [facility] to cover her until seen by PcP. No issues with transportation. No DME at home.  ToC following.  Expected Discharge Plan: Home/Self Care Barriers to Discharge: Continued Medical Work up, Inadequate or no insurance   Patient Goals and CMS Choice        Expected Discharge Plan and Services Expected Discharge Plan: Home/Self Care   Discharge Planning Services: CM Consult   Living arrangements for the past 2 months: Apartment                                      Prior Living Arrangements/Services Living arrangements for the past 2 months: Apartment Lives with:: Significant Other Patient language and need for interpreter reviewed:: Yes Do you feel safe going back to the place where you live?: Yes            Criminal Activity/Legal Involvement Pertinent to Current Situation/Hospitalization: No - Comment as needed  Activities of Daily Living Home Assistive Devices/Equipment: Eyeglasses ADL Screening (condition at time of admission) Patient's cognitive ability adequate to safely complete daily activities?: Yes Is the patient deaf or have difficulty hearing?: No Does the patient have difficulty seeing, even when wearing glasses/contacts?: No Does the patient have difficulty concentrating, remembering, or making  decisions?: Yes Patient able to express need for assistance with ADLs?: Yes Does the patient have difficulty dressing or bathing?: No Independently performs ADLs?: Yes (appropriate for developmental age) Does the patient have difficulty walking or climbing stairs?: No Weakness of Legs: Both Weakness of Arms/Hands: Both  Permission Sought/Granted                  Emotional Assessment Appearance:: Appears stated age Attitude/Demeanor/Rapport: Engaged Affect (typically observed): Accepting Orientation: : Oriented to Self, Oriented to Place, Oriented to  Time, Oriented to Situation   Psych Involvement: No (comment)  Admission diagnosis:  Seizure (HCC) [R56.9] V-tach [I47.20] Elevated blood pressure reading [R03.0] Seizures, generalized convulsive (HCC) [R56.9] Patient Active Problem List   Diagnosis Date Noted   Seizures, generalized convulsive (HCC) 01/08/2021   SVT (supraventricular tachycardia) (HCC) 01/08/2021   Hypertension    Class 2 obesity    Hyponatremia    Hypokalemia    Hypocalcemia    Transaminitis    PCP:  Pcp, No Pharmacy:   [id number] [place], [place] - [address] AT [facility] [address] [place] [address] Phone: [phone] Fax: [phone]     Social Determinants of Health (SDOH) Interventions    Readmission Risk Interventions No flowsheet data found.

## 2021-01-09 NOTE — Progress Notes (Addendum)
On 01/08/21 @ 2012   Patient was needing to use the bathroom and alerted NT. NT turned bed alarm off and significant other assisted patient to the bathroom. Patient made her way to the sink, almost to the bathroom and swayed to her right side, slipping and falling more on her R. Knee. MD was paged and  x-ray was ordered for patient. Patient complaint of mild soreness to knee and was given tylenol with no further complaints about pain throughout the night or during trips to the bedside commode.    01/08/21 2012  What Happened  Was fall witnessed? Yes  Who witnessed fall? Family member and Nurse tech  Patients activity before fall bathroom-assisted  Point of contact other (comment) (Knees)  Was patient injured? No  Follow Up  MD notified Shalhoub, MD  Time MD notified 2050  Family notified Yes - comment (At bedside)  Time family notified 2012  Additional tests Yes-comment (x-ray)  Simple treatment  (tylenol)  Progress note created (see row info) Yes  Adult Fall Risk Assessment  Risk Factor Category (scoring not indicated) High fall risk per protocol (document High fall risk);Fall has occurred during this admission (document High fall risk)  Patient Fall Risk Level High fall risk  Adult Fall Risk Interventions  Required Bundle Interventions *See Row Information* High fall risk - low, moderate, and high requirements implemented  Additional Interventions Family Supervision;PT/OT need assessed if change in mobility from baseline  Screening for Fall Injury Risk (To be completed on HIGH fall risk patients) - Assessing Need for Floor Mats  Risk For Fall Injury- Criteria for Floor Mats Previous fall this admission  Will Implement Floor Mats Yes  Vitals  Temp 98.6 F (37 C)  Temp Source Oral  BP (!) 149/104  MAP (mmHg) 119  BP Location Left Arm  BP Method Automatic  Patient Position (if appropriate) Sitting  Pulse Rate (!) 112  Pulse Rate Source Monitor  Resp 19  Oxygen Therapy   SpO2 100 %  O2 Device Room Air  Pain Assessment  Pain Scale 0-10  Pain Score 2  Pain Type Acute pain  Pain Location Knee  Pain Orientation Anterior  Pain Descriptors / Indicators Sore  Pain Frequency Occasional  Pain Onset On-going  Patients Stated Pain Goal 0  Pain Intervention(s) Pain med given for lower pain score than stated, per patient request  Neurological  Neuro (WDL) X  Level of Consciousness Alert  Orientation Level Oriented X4  Cognition Appropriate attention/concentration;Appropriate safety awareness;Follows commands  Speech Clear  Pupil Assessment  Yes  R Pupil Size (mm) 3  R Pupil Shape Round  R Pupil Reaction Brisk  L Pupil Size (mm) 3  L Pupil Shape Round  L Pupil Reaction Brisk  Motor Function/Sensation Assessment Grip;Motor response;Sensation;Motor strength  Facial Symmetry Symmetrical  R Hand Grip Present;Moderate  L Hand Grip Present;Moderate   RUE Motor Response Purposeful movement  RUE Sensation Full sensation  RUE Motor Strength 4  LUE Motor Response Purposeful movement  LUE Sensation Full sensation  LUE Motor Strength 4  RLE Motor Response Purposeful movement  RLE Sensation Full sensation  RLE Motor Strength 4  LLE Motor Response Purposeful movement  LLE Sensation Full sensation  LLE Motor Strength 4  Neuro Additional Assessments No  Glasgow Coma Scale  Eye Opening 4  Best Verbal Response (NON-intubated) 5  Best Motor Response 6  Glasgow Coma Scale Score 15  NIH Stroke Scale ( + Modified Stroke Scale Criteria)   Interval Other (  Comment) (post fall)  Level of Consciousness (1a.)    0  LOC Questions (1b. )   + 0  LOC Commands (1c. )   +  0  Best Gaze (2. )  + 0  Visual (3. )  + 0  Facial Palsy (4. )     0  Motor Arm, Left (5a. )   + 0  Motor Arm, Right (5b. )   + 0  Motor Leg, Left (6a. )   + 0  Motor Leg, Right (6b. )   + 0  Limb Ataxia (7. ) 0  Sensory (8. )   + 0  Best Language (9. )   + 0  Dysarthria (10. ) 0   Extinction/Inattention (11.)   + 0  Modified SS Total  + 0  Complete NIHSS TOTAL 0  Musculoskeletal  Musculoskeletal (WDL) X  Assistive Device None  Generalized Weakness Yes  Weight Bearing Restrictions No  Integumentary  Integumentary (WDL) X  Skin Color Appropriate for ethnicity  Skin Condition Dry  Skin Integrity Intact  Skin Turgor Non-tenting  Pain Assessment  Date Pain First Started 01/08/21  Result of Injury No  Pain Assessment  Work-Related Injury No  Pain Screening  Response to Interventions Tylenol working for pain

## 2021-01-09 NOTE — Procedures (Signed)
Patient Name: Sharon Hanson  MRN: [id number]  Epilepsy Attending: Charlsie Quest  Referring Physician/Provider: Dr Bobette Mo Date: 01/09/2021 Duration: 24.24 mins  Patient history: 40yo F with seizure like episode. EEG to evaluate for seizure.  Level of alertness: Awake  AEDs during EEG study: None  Technical aspects: This EEG study was done with scalp electrodes positioned according to the 10-20 International system of electrode placement. Electrical activity was acquired at a sampling rate of 500Hz  and reviewed with a high frequency filter of 70Hz  and a low frequency filter of 1Hz . EEG data were recorded continuously and digitally stored.   Description: The posterior dominant rhythm consists of 9 Hz activity of moderate voltage (25-35 uV) seen predominantly in posterior head regions, symmetric and reactive to eye opening and eye closing. Hyperventilation and photic stimulation were not performed.     IMPRESSION: This study is within normal limits. No seizures or epileptiform discharges were seen throughout the recording.  Estoria Geary 

## 2021-01-09 NOTE — Consult Note (Addendum)
ELECTROPHYSIOLOGY CONSULT NOTE    Patient ID: Sharon Hanson MRN: [id number], DOB/AGE: 1980-06-14 40 y.o.  Admit date: 01/08/2021 Date of Consult: 01/09/2021  Primary Physician: Pcp, No Primary Cardiologist: None  Electrophysiologist: New to Dr. Curt Bears  Referring Provider: Dr. Lupita Leash  Patient Profile: Sharon Hanson is a 40 y.o. female with a history of HTN and obesity who is being seen today for the evaluation of WCT at the request of Dr. Lupita Leash.  HPI:  [name] is a 40 y.o. female with medical history as above.   She presented to [facility] 01/08/2021 after having a seizure while at work earlier in the day. She had a second tonic-clonic seizure while in the CT scanner.  No fecal/urinary incontinence.  Mildly sedated post with poor memory of what occurred. Denied prodromal symptoms of HA, blurred vision, nausea, dizziness or gait imbalance.  No fever, rhinorrhea, sore throat, wheezing or hemoptysis.  No chest pain, diaphoresis, PND, orthopnea or pitting edema of the lower extremities.    She does report h/o frequent palpitations associated with dyspnea, up to twice a day. Typically only last up to about 10 minutes. Denies abdominal pain, nausea, emesis, diarrhea, constipation, melena or hematochezia.  No flank pain, dysuria, frequency or hematuria.  No polyuria, polydipsia, polyphagia or blurred vision.   ED Course: Temp  98.1 F, pulse 98, respirations 20, BP 166/109 mmHg and O2 sat 97% on room air.  The patient was loaded with 1500 mg of Keppra IVPB and was started on an amiodarone infusion with WCT on tele and EKG  Pertinent labwork on admission showed WBC 8.3, Hgb 12.5 g/dL, Plt 350. Mg 2.2. Na 133, K 3.4, Cl 99 and CO2 21 mmol/L.  Glucose 111, Cr 1.10 and calcium 8.2 mg/dL.  AST 93 ALT 67   Currently, pt is doing well. Being set up for EEG. She denies any family history of sudden cardiac death. Her father passed away in his 79s from known CAD. She has a brother who has had a  stroke(s). She does have a history of palpitations as above.    Past Medical History:  Diagnosis Date   Hypertension      Surgical History:  Past Surgical History:  Procedure Laterality Date   ANKLE SURGERY Right      No medications prior to admission.    Inpatient Medications:   enoxaparin (LOVENOX) injection  40 mg Subcutaneous Q24H   metoprolol tartrate  25 mg Oral BID    Allergies: No Known Allergies  Social History   Socioeconomic History   Marital status: Single    Spouse name: Not on file   Number of children: Not on file   Years of education: Not on file   Highest education level: Not on file  Occupational History   Not on file  Tobacco Use   Smoking status: Former    Types: Cigars   Smokeless tobacco: Never  Vaping Use   Vaping Use: Never used  Substance and Sexual Activity   Alcohol use: Yes    Comment: socially   Drug use: Never   Sexual activity: Yes    Birth control/protection: None  Other Topics Concern   Not on file  Social History Narrative   Not on file   Social Determinants of Health   Financial Resource Strain: Not on file  Food Insecurity: Not on file  Transportation Needs: Not on file  Physical Activity: Not on file  Stress: Not on file  Social Connections: Not on file  Intimate Partner Violence: Not on file     Family History  Problem Relation Age of Onset   Hypertension Other      Review of Systems: All other systems reviewed and are otherwise negative except as noted above.  Physical Exam: Vitals:   01/08/21 2042 01/08/21 2312 01/09/21 0357 01/09/21 0733  BP:  115/70 128/73 (!) 154/99  Pulse: 86 77 84 73  Resp:  18 18 18   Temp:  99 F (37.2 C) 98 F (36.7 C) 98.6 F (37 C)  TempSrc:  Oral Oral Oral  SpO2:  98% 99% 100%  Weight:      Height:        GEN- The patient is well appearing, alert and oriented x 3 today.   HEENT: normocephalic, atraumatic; sclera clear, conjunctiva pink; hearing intact; oropharynx  clear; neck supple Lungs- Clear to ausculation bilaterally, normal work of breathing.  No wheezes, rales, rhonchi Heart- Regular rate and rhythm, no murmurs, rubs or gallops GI- soft, non-tender, non-distended, bowel sounds present Extremities- no clubbing, cyanosis, or edema; DP/PT/radial pulses 2+ bilaterally MS- no significant deformity or atrophy Skin- warm and dry, no rash or lesion Psych- euthymic mood, full affect Neuro- strength and sensation are intact  Labs:   Lab Results  Component Value Date   WBC 8.3 01/08/2021   HGB 12.5 01/08/2021   HCT 39.8 01/08/2021   MCV 84.1 01/08/2021   PLT 350 01/08/2021    Recent Labs  Lab 01/08/21 1355 01/08/21 2238  NA 133* 134*  K 3.4* 3.3*  CL 99 101  CO2 21* 20*  BUN 13 7  CREATININE 1.10* 1.09*  CALCIUM 8.2* 8.1*  PROT 7.8  --   BILITOT 1.2  --   ALKPHOS 99  --   ALT 67*  --   AST 93*  --   GLUCOSE 111* 98      Radiology/Studies: CT Head Wo Contrast  Result Date: 01/08/2021 CLINICAL DATA:  Seizure, nontraumatic (Age 53-40y) EXAM: CT HEAD WITHOUT CONTRAST TECHNIQUE: Contiguous axial images were obtained from the base of the skull through the vertex without intravenous contrast. COMPARISON:  None. FINDINGS: Brain: No acute intracranial abnormality. Specifically, no hemorrhage, hydrocephalus, mass lesion, acute infarction, or significant intracranial injury. Vascular: No hyperdense vessel or unexpected calcification. Skull: No acute calvarial abnormality. Sinuses/Orbits: No acute findings Other: None IMPRESSION: Normal study. Electronically Signed   By: Rolm Baptise M.D.   On: 01/08/2021 17:20   MR BRAIN WO CONTRAST  Result Date: 01/08/2021 CLINICAL DATA:  Seizure, nontraumatic (Age 3-40y) EXAM: MRI HEAD WITHOUT CONTRAST TECHNIQUE: Multiplanar, multiecho pulse sequences of the brain and surrounding structures were obtained without intravenous contrast. COMPARISON:  None. FINDINGS: Mildly motion study. Brain: No acute  infarction, hemorrhage, hydrocephalus, extra-axial collection or mass lesion. The hippocampi are within normal limits and symmetric in size/signal. Partially empty and expanded sella. Vascular: Major arterial flow voids are maintained at the skull base. Skull and upper cervical spine: Normal marrow signal. Sinuses/Orbits: Clear sinuses.  Unremarkable orbits. Other: No mastoid effusions. IMPRESSION: 1. No evidence of acute abnormality. 2. Partially empty and expanded sella, which is often a normal anatomic variant but can be associated with idiopathic intracranial hypertension. Electronically Signed   By: Margaretha Sheffield M.D.   On: 01/08/2021 18:36   DG Chest Portable 1 View  Result Date: 01/08/2021 CLINICAL DATA:  Syncope and seizure. EXAM: PORTABLE CHEST 1 VIEW COMPARISON:  Radiograph 10/06/2020 FINDINGS: The cardiomediastinal contours are normal. Stable prominence of the right and  left paratracheal stripe. Unchanged mild elevation of right hemidiaphragm. Pulmonary vasculature is normal. No consolidation, pleural effusion, or pneumothorax. No acute osseous abnormalities are seen. IMPRESSION: No acute chest findings. Electronically Signed   By: Narda Rutherford M.D.   On: 01/08/2021 17:42   DG Knee Complete 4 Views Right  Result Date: 01/08/2021 CLINICAL DATA:  Fall, right knee pain. EXAM: RIGHT KNEE - COMPLETE 4+ VIEW COMPARISON:  None. FINDINGS: No evidence of fracture, dislocation, or joint effusion. Mild medial tibiofemoral joint space narrowing. Trace patellofemoral spurring. There is a small quadriceps tendon enthesophyte. Soft tissues are unremarkable. IMPRESSION: 1. No fracture or dislocation. 2. Mild degenerative change. 3. Small quadriceps tendon enthesophyte. Electronically Signed   By: Narda Rutherford M.D.   On: 01/08/2021 22:36    EKG:on arrival shows WCT at 226 bpm, most likely VT (personally reviewed)    Baseline EKG 10/06/20 showed sinus tach at 112 bpm with normal  intervals  TELEMETRY: NSR with relatively frequent PVCs (personally reviewed)  Assessment/Plan: 1.  WCT / PVCs QT ~470 today Echo pending. None in system for comparison Appears VT by review Currently quiescent on amiodarone K 3.2 this am. Mg 2.3. 40 meq K q 3 x 2 ordered. Repeat BMET this evening for goal to keep K > 4.0 and Mg > 2.0  cMRI may be additionally helpful pending Echo. Kaylub Detienne possibly need ICD  2. Seizure Disorder Reports of tongue biting in sleep and ? Seizure episode in 2016, also reports seizures as a child On Keppra Neurology following EEG being set up now  EP to follow.  For questions or updates, please contact [facility] Please consult [email or web address] for contact info under Cardiology/STEMI.  Signed, Graciella Freer, PA-C  01/09/2021 8:55 AM   I have seen and examined this patient with Otilio Saber.  Agree with above, note added to reflect my findings.  On exam, RRR, no murmurs, lungs clear, no JVD, no LE edema.  Patient presented emergency room after having a syncopal event while at work.  She was described as having seizure-like activity.  She went for head CT while in the emergency room and had a second generalized tonic-clonic seizure.  She was postictal and had tongue biting.  She was found to be tachycardic with heart rates into the 240s and was given Ativan and metoprolol.  Unfortunately she had a wide-complex tachycardia.  Her wide-complex tachycardia is potentially ventricular tachycardia and may contribute to her episodes of syncope.  Alternatively, she could have an accessory pathway resulting in a wide-complex tachycardia.  We Ison Wichmann plan for transthoracic echo today.  Depending on transthoracic echo, cardiac MRI would likely be warranted.  She Armin Yerger likely need an EP study to rule out an accessory pathway.  If this is ruled out, ICD implant would be reasonable.  Would continue IV amiodarone throughout the day today.  With surgery to p.o. amiodarone  400 mg twice daily tomorrow.  EP to see as needed through the weekend.  We Clayten Allcock Cagney Steenson continue to follow on Monday assuming that she has no further arrhythmias.  Sieanna Vanstone M. Oktober Glazer MD 01/09/2021 12:48 PM

## 2021-01-09 NOTE — Progress Notes (Signed)
EEG complete - results pending 

## 2021-01-09 NOTE — Progress Notes (Signed)
Subjective: No further seizures overnight.  After discussion with her and her family member, her seizure history becomes more clear.  When she was 22, she had an episode of facial twitching that lasted for couple of minutes.  She then went to bed and when she awoke she had side tongue biting and had been incontinent.  She did not seek care at that time.  Per her family member, she does occasionally have staring spells where for 30 seconds to a minute you will not be able to get her attention even if you try, these are not frequent but have made an impression on the family member.  The patient is not aware of these episodes.  Exam: Vitals:   01/09/21 0357 01/09/21 0733  BP: 128/73 (!) 154/99  Pulse: 84 73  Resp: 18 18  Temp: 98 F (36.7 C) 98.6 F (37 C)  SpO2: 99% 100%   Gen: In bed, NAD Resp: non-labored breathing, no acute distress Abd: soft, nt  Neuro: MS: Awake, alert, oriented and appropriate CN: Visual fields full, EOMI Motor: Moves all extremities well Sensory: Intact light touch  Pertinent Labs: Calcium 7.9 with normal albumin.  Impression: 40 year old female with a history of single previous episode concerning for seizure as well as staring spells who presents with two seizures yesterday.  Given the history, I would favor continued antiepileptic therapy despite a normal MRI and EEG.  I discussed driving probations with the family.  Recommendations: 1) continue Keppra 500 mg twice daily 2) appreciate cardiology consultation 3) no further recommendations from a neurology perspective, neurology will be available on an as-needed basis.  Ritta Slot, MD [facility] [phone]  If 7pm- 7am, please page neurology on call as listed in AMION.

## 2021-01-09 NOTE — Progress Notes (Signed)
Initial Nutrition Assessment  DOCUMENTATION CODES:   Obesity unspecified  INTERVENTION:  - Ensure Max po BID, each supplement provides 150 kcal and 30 grams of protein.   NUTRITION DIAGNOSIS:   Increased nutrient needs related to acute illness as evidenced by estimated needs.  GOAL:   Patient will meet greater than or equal to 90% of their needs  MONITOR:   PO intake  REASON FOR ASSESSMENT:   Malnutrition Screening Tool    ASSESSMENT:   Pt admitted after having a seizure at work. PMH includes HTN and obesity.  PTA poor appetite since yesterday. Today, her appetite is slowly improving. Pt states that she has been trying to change her diet to lose weight. Reports weight loss but steady over the past year through diet changes. She endorses occasional alcohol intake on special occassions however d/t seizure is considering no more intake. She has been incorporating more vegetables and protein and eats smaller meals throughout the day that consist of mostly snack items like chips and crackers and a small dinner. Spoke with pt on the importance of consistent nutrition throughout the day for sustained energy and adequate nutritional intake.   Pt was wanting to order lunch but did not have menu. Obtained menu from nurse tech and provided pt with menu.  Reports a usual weight of 236 lbs. And endorses slow weight loss over the past year. Per review of chart, noted 6% weight loss x3 months.  Medications: reviewed  Labs: sodium 134, potassium 3.3 (receiving replacement), Cr 1.09, AST 93, ALT 67  NUTRITION - FOCUSED PHYSICAL EXAM:  Flowsheet Row Most Recent Value  Orbital Region No depletion  Upper Arm Region No depletion  Thoracic and Lumbar Region No depletion  Buccal Region No depletion  Temple Region No depletion  Clavicle Bone Region No depletion  Clavicle and Acromion Bone Region No depletion  Scapular Bone Region No depletion  Dorsal Hand No depletion  Patellar Region No  depletion  Anterior Thigh Region No depletion  Posterior Calf Region No depletion  Edema (RD Assessment) None  Hair Reviewed  Eyes Reviewed  Mouth Reviewed  Skin Reviewed  Nails Reviewed      Diet Order:   Diet Order             Diet Heart Room service appropriate? Yes; Fluid consistency: Thin  Diet effective now                  EDUCATION NEEDS:   Education needs have been addressed  Skin:  Skin Assessment: Reviewed RN Assessment  Last BM:  PTA  Height:   Ht Readings from Last 1 Encounters:  01/08/21 5\' 7"  (1.702 m)    Weight:   Wt Readings from Last 1 Encounters:  01/08/21 111.1 kg    Ideal Body Weight:  61.4 kg  BMI:  Body mass index is 38.37 kg/m.  Estimated Nutritional Needs:   Kcal:  1800-2000  Protein:  90-100g  Fluid:  >1.8L  01/10/21, RDN, LDN Clinical Nutrition

## 2021-01-10 LAB — BASIC METABOLIC PANEL
Anion gap: 5 (ref 5–15)
BUN: 5 mg/dL — ABNORMAL LOW (ref 6–20)
CO2: 20 mmol/L — ABNORMAL LOW (ref 22–32)
Calcium: 7.8 mg/dL — ABNORMAL LOW (ref 8.9–10.3)
Chloride: 110 mmol/L (ref 98–111)
Creatinine, Ser: 1.09 mg/dL — ABNORMAL HIGH (ref 0.44–1.00)
GFR, Estimated: >60 mL/min (ref >60)
Glucose, Bld: 105 mg/dL — ABNORMAL HIGH (ref 70–99)
Potassium: 3.6 mmol/L (ref 3.5–5.1)
Sodium: 135 mmol/L (ref 135–145)

## 2021-01-10 LAB — MAGNESIUM: Magnesium: 1.9 mg/dL (ref 1.7–2.4)

## 2021-01-10 MED ORDER — CALCIUM CARBONATE ANTACID 500 MG PO CHEW
1.0000 | CHEWABLE_TABLET | Freq: Three times a day (TID) | ORAL | Status: DC
  Administered 2021-01-10 – 2021-01-13 (×8): 200 mg via ORAL
  Filled 2021-01-10 (×8): qty 1

## 2021-01-10 MED ORDER — POTASSIUM CHLORIDE CRYS ER 20 MEQ PO TBCR
20.0000 meq | EXTENDED_RELEASE_TABLET | Freq: Every day | ORAL | Status: DC
  Administered 2021-01-10 – 2021-01-13 (×4): 20 meq via ORAL
  Filled 2021-01-10 (×4): qty 1

## 2021-01-10 MED ORDER — POTASSIUM CHLORIDE CRYS ER 20 MEQ PO TBCR
40.0000 meq | EXTENDED_RELEASE_TABLET | Freq: Once | ORAL | Status: AC
  Administered 2021-01-10: 40 meq via ORAL
  Filled 2021-01-10: qty 2

## 2021-01-10 MED ORDER — AMIODARONE HCL 200 MG PO TABS
400.0000 mg | ORAL_TABLET | Freq: Two times a day (BID) | ORAL | Status: DC
  Administered 2021-01-10: 400 mg via ORAL
  Filled 2021-01-10: qty 2

## 2021-01-10 MED ORDER — MAGNESIUM OXIDE -MG SUPPLEMENT 400 (240 MG) MG PO TABS
400.0000 mg | ORAL_TABLET | Freq: Two times a day (BID) | ORAL | Status: AC
  Administered 2021-01-10 – 2021-01-12 (×5): 400 mg via ORAL
  Filled 2021-01-10 (×6): qty 1

## 2021-01-10 NOTE — Progress Notes (Signed)
PROGRESS NOTE    Sharon Hanson  CHY:[id number] DOB: March 13, 1980 DOA: 01/08/2021 PCP: Pcp, No   Chief Complaint  Patient presents with   Seizures   Brief Narrative/Hospital Course:  Sharon Hanson, 40 y.o. female with PMH of hypertension obesity episode described as strong diet likely seizure did not have any work-up for doctor visit brought to the ED after having seizure episode at work.In the ED while getting CT scan set on the episode of G TCS was monosaturated did not remember the event.  She is admitted for further management.  She was given Keppra.  Following postictal state and tongue biting she was found to be tachycardic with heart rate into 240s-cardiology was also consulted concerned about  V. tach placed on amiodarone. Patient was transferred to [facility] and seen by neurology.  Subjective: Seen and examined this morning no new complaints resting comfortably well.  Assessment & Plan:  Seizures, generalized convulsive: With previous history of for seizure x1, and presented with seizure episodes x2- MRI - partially empty sella.  Underwent EEG and neurology evaluation, recommending to continue with Keppra 500 twice daily, nondriving x6 months and I will follow-up with neurology outpatient.  Continue seizure precaution.    WCT/PVCs :Tachycardia heart rate in 240s in ED-seen by EP cardiology underwent echocardiogram-normal function, G1 DD, normal mitral valve aortic valve further work up- AWAITING cardiac MRI and EP eval/ICD potentially. Remains on amiodarone.  Monitor electrolytes and replete as needed.  Continue to monitor in telemetry.TSH/free T4 pending.Monitor in telemetry. Cont amiodarone infusion and metoprolol 25 twice daily.  Hypokalemia: Replete p.o. this morning to keep at least around 4.   Hypomagnesia 1.9 replete to keep close to 2.   Hypocalcemia-add po calcium supplementation.  Hypertension: Fairly controlled.  Not on meds  Hyponatremia: Resolved.    Transaminitis: Question fatty liver versus alcohol use related.  Monitor labs. Alcohol use: Discussed about cessation.  Monitor for signs of withdrawal  Class II Obesity:Patient's Body mass index is 38.37 kg/m. : Will benefit with PCP follow-up, weight loss  healthy lifestyle and outpatient sleep evaluation.  DVT prophylaxis: enoxaparin (LOVENOX) injection 40 mg Start: 01/08/21 2200 Code Status:   Code Status: Full Code Family Communication: plan of care discussed with patient and significant other at bedside. 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 Anticipated Disposition: home   Objective: Vitals last 24 hrs: Vitals:   01/09/21 2343 01/10/21 0306 01/10/21 0425 01/10/21 0749  BP: 130/90 111/81 115/74 (!) 149/97  Pulse: 80 79 71 69  Resp: 18 18 16 17   Temp: 97.6 F (36.4 C) 98.4 F (36.9 C) 97.6 F (36.4 C) 98.6 F (37 C)  TempSrc: Oral Oral Oral Oral  SpO2: 100% 99% 99% 100%  Weight:      Height:       Weight change:   Intake/Output Summary (Last 24 hours) at 01/10/2021 1014 Last data filed at 01/10/2021 0900 Gross per 24 hour  Intake 1090.91 ml  Output --  Net 1090.91 ml   Net IO Since Admission: 1,090.91 mL [01/10/21 1014]   Physical Examination: General exam: AAOx 3, obese, pleasant, weak appearing. HEENT:Oral mucosa moist, Ear/Nose WNL grossly, dentition normal. Respiratory system: bilaterally clear BS, no use of accessory muscle Cardiovascular system: S1 & S2 +, No JVD,. Gastrointestinal system: Abdomen soft, NT,ND, BS+ Nervous System:Alert, awake, moving extremities and grossly nonfocal Extremities:  edema, distal peripheral pulses palpable.  Skin: No rashes,no icterus.  MSK: Normal muscle bulk,tone, power   Medications reviewed:  Scheduled Meds:  enoxaparin (LOVENOX) injection  40 mg Subcutaneous Q24H   metoprolol tartrate  25 mg  Oral BID   potassium chloride  20 mEq Oral Daily   Ensure Max Protein  11 oz Oral BID   Continuous Infusions:  amiodarone 30 mg/hr (01/10/21 0954)   levETIRAcetam 500 mg (01/10/21 [id number])     Diet Order             Diet Heart Room service appropriate? Yes; Fluid consistency: Thin  Diet effective now                   Weight change:   Wt Readings from Last 3 Encounters:  01/08/21 111.1 kg  10/06/20 117.9 kg     Consultants:see note  Procedures:see note Antimicrobials: Anti-infectives (From admission, onward)    None      Culture/Microbiology No results found for: SDES, SPECREQUEST, CULT, REPTSTATUS  Other culture-see note  Unresulted Labs (From admission, onward)     Start     Ordered   01/15/21 0500  Creatinine, serum  (enoxaparin (LOVENOX)    CrCl >/= 30 ml/min)  Weekly,   R     Comments: while on enoxaparin therapy    01/08/21 1802   01/09/21 0857  T4, free  Once,   R        01/09/21 0856   01/09/21 0856  TSH  Add-on,   AD        01/09/21 0856           Data Reviewed: I have personally reviewed following labs and imaging studies CBC: Recent Labs  Lab 01/08/21 1355  WBC 8.3  NEUTROABS 6.6  HGB 12.5  HCT 39.8  MCV 84.1  PLT [id number]    Basic Metabolic Panel: Recent Labs  Lab 01/08/21 1355 01/08/21 2238 01/09/21 0840 01/10/21 0654  NA 133* 134* 135 135  K 3.4* 3.3* 3.2* 3.6  CL 99 101 105 110  CO2 21* 20* 23 20*  GLUCOSE 111* 98 104* 105*  BUN 13 7 6  5*  CREATININE 1.10* 1.09* 1.10* 1.09*  CALCIUM 8.2* 8.1* 7.9* 7.8*  MG 2.2 1.9 2.3 1.9    GFR: Estimated Creatinine Clearance: 89 mL/min (A) (by C-G formula based on SCr of 1.09 mg/dL (H)). Liver Function Tests: Recent Labs  Lab 01/08/21 1355 01/09/21 0840  AST 93* 104*  ALT 67* 60*  ALKPHOS 99 81  BILITOT 1.2 1.7*  PROT 7.8 6.3*  ALBUMIN 4.1 3.1*    No results for input(s): LIPASE, AMYLASE in the last 168 hours. No results for input(s): AMMONIA in the last 168  hours. Coagulation Profile: No results for input(s): INR, PROTIME in the last 168 hours. Cardiac Enzymes: No results for input(s): CKTOTAL, CKMB, CKMBINDEX, TROPONINI in the last 168 hours. BNP (last 3 results) No results for input(s): PROBNP in the last 8760 hours. HbA1C: No results for input(s): HGBA1C in the last 72 hours. CBG: No results for input(s): GLUCAP in the last 168 hours. Lipid Profile: No results for input(s): CHOL, HDL, LDLCALC, TRIG, CHOLHDL, LDLDIRECT in the last 72 hours. Thyroid Function Tests: No results for input(s): TSH, T4TOTAL, FREET4, T3FREE, THYROIDAB in the last 72 hours. Anemia Panel: No results for input(s): VITAMINB12, FOLATE, FERRITIN, TIBC, IRON, RETICCTPCT in the last 72 hours. Sepsis Labs: No results for input(s): PROCALCITON, LATICACIDVEN in the last 168 hours.  Recent Results (from the past 240 hour(s))  Resp Panel  by RT-PCR (Flu A&B, Covid) Nasopharyngeal Swab     Status: None   Collection Time: 01/08/21  3:40 PM   Specimen: Nasopharyngeal Swab; Nasopharyngeal(NP) swabs in vial transport medium  Result Value Ref Range Status   SARS Coronavirus 2 by RT PCR NEGATIVE NEGATIVE Final    Comment: (NOTE) SARS-CoV-2 target nucleic acids are NOT DETECTED.  The SARS-CoV-2 RNA is generally detectable in upper respiratory specimens during the acute phase of infection. The lowest concentration of SARS-CoV-2 viral copies this assay can detect is 138 copies/mL. A negative result does not preclude SARS-Cov-2 infection and should not be used as the sole basis for treatment or other patient management decisions. A negative result may occur with  improper specimen collection/handling, submission of specimen other than nasopharyngeal swab, presence of viral mutation(s) within the areas targeted by this assay, and inadequate number of viral copies(<138 copies/mL). A negative result must be combined with clinical observations, patient history, and  epidemiological information. The expected result is Negative.  Fact Sheet for Patients:  EntrepreneurPulse.com.au  Fact Sheet for Healthcare Providers:  IncredibleEmployment.be  This test is no t yet approved or cleared by the [place] FDA and  has been authorized for detection and/or diagnosis of SARS-CoV-2 by FDA under an Emergency Use Authorization (EUA). This EUA will remain  in effect (meaning this test can be used) for the duration of the COVID-19 declaration under Section 564(b)(1) of the Act, 21 U.S.C.section 360bbb-3(b)(1), unless the authorization is terminated  or revoked sooner.       Influenza A by PCR NEGATIVE NEGATIVE Final   Influenza B by PCR NEGATIVE NEGATIVE Final    Comment: (NOTE) The Xpert Xpress SARS-CoV-2/FLU/RSV plus assay is intended as an aid in the diagnosis of influenza from Nasopharyngeal swab specimens and should not be used as a sole basis for treatment. Nasal washings and aspirates are unacceptable for Xpert Xpress SARS-CoV-2/FLU/RSV testing.  Fact Sheet for Patients: EntrepreneurPulse.com.au  Fact Sheet for Healthcare Providers: IncredibleEmployment.be  This test is not yet approved or cleared by the [place] FDA and has been authorized for detection and/or diagnosis of SARS-CoV-2 by FDA under an Emergency Use Authorization (EUA). This EUA will remain in effect (meaning this test can be used) for the duration of the COVID-19 declaration under Section 564(b)(1) of the Act, 21 U.S.C. section 360bbb-3(b)(1), unless the authorization is terminated or revoked.  Performed at [facility], [place] [address], [place], [place] [address]       Radiology Studies: CT Head Wo Contrast  Result Date: 01/08/2021 CLINICAL DATA:  Seizure, nontraumatic (Age 30-40y) EXAM: CT HEAD WITHOUT CONTRAST TECHNIQUE: Contiguous axial images were obtained from the  base of the skull through the vertex without intravenous contrast. COMPARISON:  None. FINDINGS: Brain: No acute intracranial abnormality. Specifically, no hemorrhage, hydrocephalus, mass lesion, acute infarction, or significant intracranial injury. Vascular: No hyperdense vessel or unexpected calcification. Skull: No acute calvarial abnormality. Sinuses/Orbits: No acute findings Other: None IMPRESSION: Normal study. Electronically Signed   By: Rolm Baptise M.D.   On: 01/08/2021 17:20   MR BRAIN WO CONTRAST  Result Date: 01/08/2021 CLINICAL DATA:  Seizure, nontraumatic (Age 44-40y) EXAM: MRI HEAD WITHOUT CONTRAST TECHNIQUE: Multiplanar, multiecho pulse sequences of the brain and surrounding structures were obtained without intravenous contrast. COMPARISON:  None. FINDINGS: Mildly motion study. Brain: No acute infarction, hemorrhage, hydrocephalus, extra-axial collection or mass lesion. The hippocampi are within normal limits and symmetric in size/signal. Partially empty and expanded sella. Vascular: Major arterial flow  voids are maintained at the skull base. Skull and upper cervical spine: Normal marrow signal. Sinuses/Orbits: Clear sinuses.  Unremarkable orbits. Other: No mastoid effusions. IMPRESSION: 1. No evidence of acute abnormality. 2. Partially empty and expanded sella, which is often a normal anatomic variant but can be associated with idiopathic intracranial hypertension. Electronically Signed   By: Margaretha Sheffield M.D.   On: 01/08/2021 18:36   DG Chest Portable 1 View  Result Date: 01/08/2021 CLINICAL DATA:  Syncope and seizure. EXAM: PORTABLE CHEST 1 VIEW COMPARISON:  Radiograph 10/06/2020 FINDINGS: The cardiomediastinal contours are normal. Stable prominence of the right and left paratracheal stripe. Unchanged mild elevation of right hemidiaphragm. Pulmonary vasculature is normal. No consolidation, pleural effusion, or pneumothorax. No acute osseous abnormalities are seen. IMPRESSION: No  acute chest findings. Electronically Signed   By: Keith Rake M.D.   On: 01/08/2021 17:42   DG Knee Complete 4 Views Right  Result Date: 01/08/2021 CLINICAL DATA:  Fall, right knee pain. EXAM: RIGHT KNEE - COMPLETE 4+ VIEW COMPARISON:  None. FINDINGS: No evidence of fracture, dislocation, or joint effusion. Mild medial tibiofemoral joint space narrowing. Trace patellofemoral spurring. There is a small quadriceps tendon enthesophyte. Soft tissues are unremarkable. IMPRESSION: 1. No fracture or dislocation. 2. Mild degenerative change. 3. Small quadriceps tendon enthesophyte. Electronically Signed   By: Keith Rake M.D.   On: 01/08/2021 22:36   EEG adult  Result Date: 01/09/2021 Lora Havens, MD     01/09/2021 12:16 PM Patient Name: Luigina Oskey MRN: [id number] Epilepsy Attending: Lora Havens Referring Physician/Provider: Dr Reubin Milan Date: 01/09/2021 Duration: 24.24 mins Patient history: 40yo F with seizure like episode. EEG to evaluate for seizure. Level of alertness: Awake AEDs during EEG study: None Technical aspects: This EEG study was done with scalp electrodes positioned according to the 10-20 International system of electrode placement. Electrical activity was acquired at a sampling rate of 500Hz  and reviewed with a high frequency filter of 70Hz  and a low frequency filter of 1Hz . EEG data were recorded continuously and digitally stored. Description: The posterior dominant rhythm consists of 9 Hz activity of moderate voltage (25-35 uV) seen predominantly in posterior head regions, symmetric and reactive to eye opening and eye closing. Hyperventilation and photic stimulation were not performed.   IMPRESSION: This study is within normal limits. No seizures or epileptiform discharges were seen throughout the recording. Lora Havens   ECHOCARDIOGRAM COMPLETE  Result Date: 01/09/2021    ECHOCARDIOGRAM REPORT   Patient Name:   PAOLA KARAN Date of Exam:  01/09/2021 Medical Rec #:  [id number]        Height:       67.0 in Accession #:    [id number]       Weight:       245.0 lb Date of Birth:  [birth year]        BSA:          2.204 m Patient Age:    79 years         BP:           154/99 mmHg Patient Gender: F                HR:           76 bpm. Exam Location:  Inpatient Procedure: 2D Echo, Color Doppler and Cardiac Doppler Indications:    R94.31 Abnormal EKG  History:        Patient has no prior history of Echocardiogram examinations.  Risk Factors:Hypertension.  Sonographer:    Raquel Sarna Senior RDCS Referring Phys: [id number] [place]  1. Left ventricular ejection fraction, by estimation, is 55 to 60%. The left ventricle has normal function. The left ventricle has no regional wall motion abnormalities. There is mild concentric left ventricular hypertrophy. Left ventricular diastolic parameters are consistent with Grade I diastolic dysfunction (impaired relaxation).  2. Right ventricular systolic function is normal. The right ventricular size is normal. There is normal pulmonary artery systolic pressure.  3. The mitral valve is normal in structure. Trivial mitral valve regurgitation. No evidence of mitral stenosis.  4. The aortic valve is tricuspid. Aortic valve regurgitation is not visualized. No aortic stenosis is present.  5. The inferior vena cava is normal in size with greater than 50% respiratory variability, suggesting right atrial pressure of 3 mmHg. FINDINGS  Left Ventricle: Left ventricular ejection fraction, by estimation, is 55 to 60%. The left ventricle has normal function. The left ventricle has no regional wall motion abnormalities. The left ventricular internal cavity size was normal in size. There is  mild concentric left ventricular hypertrophy. Left ventricular diastolic parameters are consistent with Grade I diastolic dysfunction (impaired relaxation). Normal left ventricular filling pressure. Right Ventricle: The right  ventricular size is normal. No increase in right ventricular wall thickness. Right ventricular systolic function is normal. There is normal pulmonary artery systolic pressure. The tricuspid regurgitant velocity is 1.31 m/s, and  with an assumed right atrial pressure of 3 mmHg, the estimated right ventricular systolic pressure is 9.9 mmHg. Left Atrium: Left atrial size was normal in size. Right Atrium: Right atrial size was normal in size. Pericardium: There is no evidence of pericardial effusion. Mitral Valve: The mitral valve is normal in structure. Trivial mitral valve regurgitation. No evidence of mitral valve stenosis. Tricuspid Valve: The tricuspid valve is normal in structure. Tricuspid valve regurgitation is trivial. No evidence of tricuspid stenosis. Aortic Valve: The aortic valve is tricuspid. Aortic valve regurgitation is not visualized. No aortic stenosis is present. Pulmonic Valve: The pulmonic valve was normal in structure. Pulmonic valve regurgitation is trivial. No evidence of pulmonic stenosis. Aorta: The aortic root is normal in size and structure. Venous: The inferior vena cava is normal in size with greater than 50% respiratory variability, suggesting right atrial pressure of 3 mmHg. IAS/Shunts: No atrial level shunt detected by color flow Doppler.  LEFT VENTRICLE PLAX 2D LVIDd:         4.70 cm   Diastology LVIDs:         3.20 cm   LV e' medial:    5.87 cm/s LV PW:         1.20 cm   LV E/e' medial:  7.6 LV IVS:        1.10 cm   LV e' lateral:   6.85 cm/s LVOT diam:     2.00 cm   LV E/e' lateral: 6.5 LV SV:         46 LV SV Index:   21 LVOT Area:     3.14 cm  RIGHT VENTRICLE RV S prime:     14.90 cm/s TAPSE (M-mode): 2.0 cm LEFT ATRIUM             Index        RIGHT ATRIUM           Index LA diam:        3.70 cm 1.68 cm/m   RA Area:     18.40 cm  LA Vol (A2C):   60.7 ml 27.54 ml/m  RA Volume:   55.80 ml  25.31 ml/m LA Vol (A4C):   55.4 ml 25.13 ml/m LA Biplane Vol: 62.5 ml 28.35 ml/m  AORTIC  VALVE LVOT Vmax:   93.10 cm/s LVOT Vmean:  66.200 cm/s LVOT VTI:    0.148 m  AORTA Ao Root diam: 3.00 cm Ao Asc diam:  3.10 cm MITRAL VALVE               TRICUSPID VALVE MV Area (PHT): 2.57 cm    TR Peak grad:   6.9 mmHg MV Decel Time: 295 msec    TR Vmax:        131.00 cm/s MV E velocity: 44.60 cm/s MV A velocity: 48.50 cm/s  SHUNTS MV E/A ratio:  0.92        Systemic VTI:  0.15 m                            Systemic Diam: 2.00 cm Skeet Latch MD Electronically signed by Skeet Latch MD Signature Date/Time: 01/09/2021/6:36:19 PM    Final      LOS: 1 day   Antonieta Pert, MD [facility]  01/10/2021, 10:14 AM

## 2021-01-10 NOTE — Progress Notes (Signed)
Noticed patient's telemtery reading 'Sinus bradycardia' and 'asystole', which alerted me to check on the patient. Patient is AAOx4 with no symptoms. Assessed cardiac monitoring leads and the RL lead was off of the patient.   MD has been paged to make aware.  Most recent vital signs are as followed:   01/10/21 0425  Vitals  Temp 97.6 F (36.4 C)  Temp Source Oral  BP 115/74  MAP (mmHg) 88  BP Location Left Arm  BP Method Automatic  Patient Position (if appropriate) Lying  Pulse Rate 71  Pulse Rate Source Monitor  ECG Heart Rate 70  Resp 16  Level of Consciousness  Level of Consciousness Alert  MEWS COLOR  MEWS Score Color Green  Oxygen Therapy  SpO2 99 %  O2 Device Room Air  MEWS Score  MEWS Temp 0  MEWS Systolic 0  MEWS Pulse 0  MEWS RR 0  MEWS LOC 0  MEWS Score 0

## 2021-01-11 LAB — COMPREHENSIVE METABOLIC PANEL
ALT: 60 U/L — ABNORMAL HIGH (ref 0–44)
AST: 93 U/L — ABNORMAL HIGH (ref 15–41)
Albumin: 2.8 g/dL — ABNORMAL LOW (ref 3.5–5.0)
Alkaline Phosphatase: 74 U/L (ref 38–126)
Anion gap: 5 (ref 5–15)
BUN: <5 mg/dL — ABNORMAL LOW (ref 6–20)
CO2: 23 mmol/L (ref 22–32)
Calcium: 8.7 mg/dL — ABNORMAL LOW (ref 8.9–10.3)
Chloride: 108 mmol/L (ref 98–111)
Creatinine, Ser: 1.06 mg/dL — ABNORMAL HIGH (ref 0.44–1.00)
GFR, Estimated: >60 mL/min (ref >60)
Glucose, Bld: 103 mg/dL — ABNORMAL HIGH (ref 70–99)
Potassium: 3.7 mmol/L (ref 3.5–5.1)
Sodium: 136 mmol/L (ref 135–145)
Total Bilirubin: 1.2 mg/dL (ref 0.3–1.2)
Total Protein: 6.1 g/dL — ABNORMAL LOW (ref 6.5–8.1)

## 2021-01-11 LAB — TSH: TSH: 4.922 u[IU]/mL — ABNORMAL HIGH (ref 0.350–4.500)

## 2021-01-11 LAB — T4, FREE: Free T4: 0.83 ng/dL (ref 0.61–1.12)

## 2021-01-11 LAB — SURGICAL PCR SCREEN
MRSA, PCR: POSITIVE — AB
Staphylococcus aureus: POSITIVE — AB

## 2021-01-11 MED ORDER — LEVETIRACETAM 500 MG PO TABS
500.0000 mg | ORAL_TABLET | Freq: Two times a day (BID) | ORAL | Status: DC
  Administered 2021-01-11 – 2021-01-13 (×4): 500 mg via ORAL
  Filled 2021-01-11 (×4): qty 1

## 2021-01-11 MED ORDER — CHLORHEXIDINE GLUCONATE CLOTH 2 % EX PADS
6.0000 | MEDICATED_PAD | Freq: Every day | CUTANEOUS | Status: DC
  Administered 2021-01-12: 6 via TOPICAL

## 2021-01-11 MED ORDER — HYDRALAZINE HCL 20 MG/ML IJ SOLN: 10.0000 mg | Freq: Four times a day (QID) | INTRAMUSCULAR | Status: DC | PRN

## 2021-01-11 MED ORDER — MUPIROCIN 2 % EX OINT
1.0000 "application " | TOPICAL_OINTMENT | Freq: Two times a day (BID) | CUTANEOUS | Status: DC
  Administered 2021-01-12 – 2021-01-13 (×4): 1 via NASAL
  Filled 2021-01-11 (×2): qty 22

## 2021-01-11 NOTE — Progress Notes (Signed)
Paged. Dr. Leafy Half regarding BP: 161/100.

## 2021-01-11 NOTE — Progress Notes (Signed)
Progress Note  Patient Name: Sharon Hanson Date of Encounter: 01/11/2021  [facility] Cardiologist: None   Subjective   Currently feeling well without complaint.  Inpatient Medications    Scheduled Meds:  calcium carbonate  1 tablet Oral TID   enoxaparin (LOVENOX) injection  40 mg Subcutaneous Q24H   magnesium oxide  400 mg Oral BID   metoprolol tartrate  25 mg Oral BID   potassium chloride  20 mEq Oral Daily   Ensure Max Protein  11 oz Oral BID   Continuous Infusions:  levETIRAcetam 500 mg (01/11/21 0622)   PRN Meds: acetaminophen **OR** acetaminophen, hydrALAZINE, LORazepam   Vital Signs    Vitals:   01/10/21 2329 01/10/21 2354 01/11/21 0341 01/11/21 0815  BP: (!) 162/97 (!) 148/86 (!) 161/100 (!) 164/108  Pulse: 82  72 79  Resp: 18  18 16   Temp: 98.7 F (37.1 C)  98.1 F (36.7 C) 98.4 F (36.9 C)  TempSrc: Oral  Oral Oral  SpO2: 98%  99% 98%  Weight:      Height:        Intake/Output Summary (Last 24 hours) at 01/11/2021 0845 Last data filed at 01/10/2021 1300 Gross per 24 hour  Intake 960 ml  Output --  Net 960 ml   Last 3 Weights 01/08/2021 01/08/2021 10/06/2020  Weight (lbs) 245 lb 245 lb 260 lb  Weight (kg) 111.131 kg 111.131 kg 117.935 kg      Telemetry    Sinus rhythm- Personally Reviewed  ECG    None new- Personally Reviewed  Physical Exam   GEN: No acute distress.   Neck: No JVD Cardiac: RRR, no murmurs, rubs, or gallops.  Respiratory: Clear to auscultation bilaterally. GI: Soft, nontender, non-distended  MS: No edema; No deformity. Neuro:  Nonfocal  Psych: Normal affect   Labs    High Sensitivity Troponin:  No results for input(s): TROPONINIHS in the last 720 hours.   Chemistry Recent Labs  Lab 01/08/21 1355 01/08/21 2238 01/09/21 0840 01/10/21 0654 01/11/21 0145  NA 133* 134* 135 135 136  K 3.4* 3.3* 3.2* 3.6 3.7  CL 99 101 105 110 108  CO2 21* 20* 23 20* 23  GLUCOSE 111* 98 104* 105* 103*  BUN 13 7 6  5*  <5*  CREATININE 1.10* 1.09* 1.10* 1.09* 1.06*  CALCIUM 8.2* 8.1* 7.9* 7.8* 8.7*  MG 2.2 1.9 2.3 1.9  --   PROT 7.8  --  6.3*  --  6.1*  ALBUMIN 4.1  --  3.1*  --  2.8*  AST 93*  --  104*  --  93*  ALT 67*  --  60*  --  60*  ALKPHOS 99  --  81  --  74  BILITOT 1.2  --  1.7*  --  1.2  GFRNONAA >60 >60 >60 >60 >60  ANIONGAP 13 13 7 5 5     Lipids No results for input(s): CHOL, TRIG, HDL, LABVLDL, LDLCALC, CHOLHDL in the last 168 hours.  Hematology Recent Labs  Lab 01/08/21 1355  WBC 8.3  RBC 4.73  HGB 12.5  HCT 39.8  MCV 84.1  MCH 26.4  MCHC 31.4  RDW 21.2*  PLT 350   Thyroid  Recent Labs  Lab 01/11/21 0145  TSH 4.922*  FREET4 0.83    BNPNo results for input(s): BNP, PROBNP in the last 168 hours.  DDimer No results for input(s): DDIMER in the last 168 hours.   Radiology    EEG adult  Result Date: 01/09/2021 Lora Havens, MD     01/09/2021 12:16 PM Patient Name: Sharon Hanson MRN: [id number] Epilepsy Attending: Lora Havens Referring Physician/Provider: Dr Reubin Milan Date: 01/09/2021 Duration: 24.24 mins Patient history: 40yo F with seizure like episode. EEG to evaluate for seizure. Level of alertness: Awake AEDs during EEG study: None Technical aspects: This EEG study was done with scalp electrodes positioned according to the 10-20 International system of electrode placement. Electrical activity was acquired at a sampling rate of 500Hz  and reviewed with a high frequency filter of 70Hz  and a low frequency filter of 1Hz . EEG data were recorded continuously and digitally stored. Description: The posterior dominant rhythm consists of 9 Hz activity of moderate voltage (25-35 uV) seen predominantly in posterior head regions, symmetric and reactive to eye opening and eye closing. Hyperventilation and photic stimulation were not performed.   IMPRESSION: This study is within normal limits. No seizures or epileptiform discharges were seen throughout the recording.  Lora Havens   ECHOCARDIOGRAM COMPLETE  Result Date: 01/09/2021    ECHOCARDIOGRAM REPORT   Patient Name:   Sharon Hanson Date of Exam: 01/09/2021 Medical Rec #:  [id number]        Height:       67.0 in Accession #:    [id number]       Weight:       245.0 lb Date of Birth:  November 24, 1980        BSA:          2.204 m Patient Age:    40 years         BP:           154/99 mmHg Patient Gender: F                HR:           76 bpm. Exam Location:  Inpatient Procedure: 2D Echo, Color Doppler and Cardiac Doppler Indications:    R94.31 Abnormal EKG  History:        Patient has no prior history of Echocardiogram examinations.                 Risk Factors:Hypertension.  Sonographer:    Raquel Sarna Senior RDCS Referring Phys: [id number] [place]  1. Left ventricular ejection fraction, by estimation, is 55 to 60%. The left ventricle has normal function. The left ventricle has no regional wall motion abnormalities. There is mild concentric left ventricular hypertrophy. Left ventricular diastolic parameters are consistent with Grade I diastolic dysfunction (impaired relaxation).  2. Right ventricular systolic function is normal. The right ventricular size is normal. There is normal pulmonary artery systolic pressure.  3. The mitral valve is normal in structure. Trivial mitral valve regurgitation. No evidence of mitral stenosis.  4. The aortic valve is tricuspid. Aortic valve regurgitation is not visualized. No aortic stenosis is present.  5. The inferior vena cava is normal in size with greater than 50% respiratory variability, suggesting right atrial pressure of 3 mmHg. FINDINGS  Left Ventricle: Left ventricular ejection fraction, by estimation, is 55 to 60%. The left ventricle has normal function. The left ventricle has no regional wall motion abnormalities. The left ventricular internal cavity size was normal in size. There is  mild concentric left ventricular hypertrophy. Left ventricular diastolic  parameters are consistent with Grade I diastolic dysfunction (impaired relaxation). Normal left ventricular filling pressure. Right Ventricle: The right ventricular size is normal. No increase in right ventricular wall thickness. Right  ventricular systolic function is normal. There is normal pulmonary artery systolic pressure. The tricuspid regurgitant velocity is 1.31 m/s, and  with an assumed right atrial pressure of 3 mmHg, the estimated right ventricular systolic pressure is 9.9 mmHg. Left Atrium: Left atrial size was normal in size. Right Atrium: Right atrial size was normal in size. Pericardium: There is no evidence of pericardial effusion. Mitral Valve: The mitral valve is normal in structure. Trivial mitral valve regurgitation. No evidence of mitral valve stenosis. Tricuspid Valve: The tricuspid valve is normal in structure. Tricuspid valve regurgitation is trivial. No evidence of tricuspid stenosis. Aortic Valve: The aortic valve is tricuspid. Aortic valve regurgitation is not visualized. No aortic stenosis is present. Pulmonic Valve: The pulmonic valve was normal in structure. Pulmonic valve regurgitation is trivial. No evidence of pulmonic stenosis. Aorta: The aortic root is normal in size and structure. Venous: The inferior vena cava is normal in size with greater than 50% respiratory variability, suggesting right atrial pressure of 3 mmHg. IAS/Shunts: No atrial level shunt detected by color flow Doppler.  LEFT VENTRICLE PLAX 2D LVIDd:         4.70 cm   Diastology LVIDs:         3.20 cm   LV e' medial:    5.87 cm/s LV PW:         1.20 cm   LV E/e' medial:  7.6 LV IVS:        1.10 cm   LV e' lateral:   6.85 cm/s LVOT diam:     2.00 cm   LV E/e' lateral: 6.5 LV SV:         46 LV SV Index:   21 LVOT Area:     3.14 cm  RIGHT VENTRICLE RV S prime:     14.90 cm/s TAPSE (M-mode): 2.0 cm LEFT ATRIUM             Index        RIGHT ATRIUM           Index LA diam:        3.70 cm 1.68 cm/m   RA Area:     18.40 cm  LA Vol (A2C):   60.7 ml 27.54 ml/m  RA Volume:   55.80 ml  25.31 ml/m LA Vol (A4C):   55.4 ml 25.13 ml/m LA Biplane Vol: 62.5 ml 28.35 ml/m  AORTIC VALVE LVOT Vmax:   93.10 cm/s LVOT Vmean:  66.200 cm/s LVOT VTI:    0.148 m  AORTA Ao Root diam: 3.00 cm Ao Asc diam:  3.10 cm MITRAL VALVE               TRICUSPID VALVE MV Area (PHT): 2.57 cm    TR Peak grad:   6.9 mmHg MV Decel Time: 295 msec    TR Vmax:        131.00 cm/s MV E velocity: 44.60 cm/s MV A velocity: 48.50 cm/s  SHUNTS MV E/A ratio:  0.92        Systemic VTI:  0.15 m                            Systemic Diam: 2.00 cm Chilton Si MD Electronically signed by Chilton Si MD Signature Date/Time: 01/09/2021/6:36:19 PM    Final     Cardiac Studies   TTE  1. Left ventricular ejection fraction, by estimation, is 55 to 60%. The  left ventricle has normal  function. The left ventricle has no regional  wall motion abnormalities. There is mild concentric left ventricular  hypertrophy. Left ventricular diastolic  parameters are consistent with Grade I diastolic dysfunction (impaired  relaxation).   2. Right ventricular systolic function is normal. The right ventricular  size is normal. There is normal pulmonary artery systolic pressure.   3. The mitral valve is normal in structure. Trivial mitral valve  regurgitation. No evidence of mitral stenosis.   4. The aortic valve is tricuspid. Aortic valve regurgitation is not  visualized. No aortic stenosis is present.   5. The inferior vena cava is normal in size with greater than 50%  respiratory variability, suggesting right atrial pressure of 3 mmHg.   Patient Profile     40 y.o. 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  Also found to have a wide-complex tachycardia.  Assessment & Plan    Wide-complex tachycardia: Tachycardia appears to be due to ventricular tachycardia, though with a normal echo and her age, she would  benefit from EP study.  We [place] Latulippe also get a cardiac MRI to determine whether or not she has scar or other reason for a wide-complex tachycardia.  At EP study, if she has an atrial arrhythmia or accessory pathway, we Amellia Panik plan for ablation.  If nothing is induced, Simran Mannis likely need an ICD.  We Jayshawn Colston make her n.p.o. after midnight tonight.     For questions or updates, please contact [place] Please consult [email or web address] for contact info under        Signed, Shamari Lofquist Meredith Leeds, MD  01/11/2021, 8:45 AM

## 2021-01-11 NOTE — Progress Notes (Signed)
PROGRESS NOTE    Sharon Hanson  [id number] DOB: 11/21/1980 DOA: 01/08/2021 PCP: Pcp, No   Chief Complaint  Patient presents with   Seizures   Brief Narrative/Hospital Course:  Sharon Hanson, 40 y.o. female with PMH of hypertension obesity episode described as strong diet likely seizure did not have any work-up for doctor visit brought to the ED after having seizure episode at work.In the ED while getting CT scan set on the episode of G TCS was monosaturated did not remember the event.  She is admitted for further management.  She was given Keppra.  Following postictal state and tongue biting she was found to be tachycardic with heart rate into 240s-cardiology was also consulted concerned about  V. tach placed on amiodarone. Patient was transferred to [facility] and seen by neurology and cardiology. Seen by EP.  Subjective: No new complaints Resting well overnight blood pressure running on higher side   Assessment & Plan:  WCT/PVCs : EP cardiology following, echocardiogram-normal function, G1 DD, normal mitral valve aortic valve further work up-plan for cardiac MRI and possible ICD placement.  Amiodarone discontinued this morning and planning for EP study in a.m. with possible ablation versus IDC placement, pending cardiac MRI . Cont po metoprolol, monitor electrolytes and monitoring telemetry.  TSH slightly abnormal but free T4 normal   Seizures, generalized convulsive: With previous history of for seizure x1, and presented with seizure episodes x2- MRI - partially empty sella.  Underwent EEG and neurology evaluation, recommending to continue with Keppra 500 twice daily, nondriving x6 months and I will follow-up with neurology outpatient.  Continue seizure precaution.    Hypertension: Running high overnight continue metoprolol, per cardiology   Hypokalemia: At 3.7 continue to replete orally  Hypomagnesia continue p.o. Mag-Ox  Hypocalcemia- cont po calcium supplementation.   Level up at 8.7 Hyponatremia: Resolved.    Transaminitis: Question fatty liver versus alcohol use related.  Level downtrending Alcohol use: Discussed about cessation.  Monitor for signs of withdrawal  Class II Obesity:Patient's Body mass index is 38.37 kg/m. : Will benefit with PCP follow-up, weight loss  healthy lifestyle and outpatient sleep evaluation.  DVT prophylaxis: enoxaparin (LOVENOX) injection 40 mg Start: 01/08/21 2200 Code Status:   Code Status: Full Code Family Communication: plan of care discussed with patient and significant other at bedside. 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 Anticipated Disposition: home   Objective: Vitals last 24 hrs: Vitals:   01/10/21 2000 01/10/21 2329 01/10/21 2354 01/11/21 0341  BP: (!) 146/88 (!) 162/97 (!) 148/86 (!) 161/100  Pulse:  82  72  Resp:  18  18  Temp:  98.7 F (37.1 C)  98.1 F (36.7 C)  TempSrc:  Oral  Oral  SpO2:  98%  99%  Weight:      Height:       Weight change:   Intake/Output Summary (Last 24 hours) at 01/11/2021 0801 Last data filed at 01/10/2021 1300 Gross per 24 hour  Intake 960 ml  Output --  Net 960 ml    Net IO Since Admission: 1,570.91 mL [01/11/21 0801]   Physical Examination: General exam: AAOx 3,pleasant,obese, HEENT:Oral mucosa moist, Ear/Nose WNL grossly, dentition normal. Respiratory system: bilaterally clear no use of accessory muscle Cardiovascular system: S1 & S2 +, No JVD,. Gastrointestinal system: Abdomen soft,NT,ND, BS+ Nervous System:Alert, awake, moving extremities and grossly nonfocal Extremities: No edema, distal peripheral pulses palpable.  Skin: No rashes,no  icterus. MSK: Normal muscle bulk,tone, power    Medications reviewed:  Scheduled Meds:  calcium carbonate  1 tablet Oral TID   enoxaparin (LOVENOX) injection  40 mg Subcutaneous Q24H   magnesium  oxide  400 mg Oral BID   metoprolol tartrate  25 mg Oral BID   potassium chloride  20 mEq Oral Daily   Ensure Max Protein  11 oz Oral BID   Continuous Infusions:  levETIRAcetam 500 mg (01/11/21 0622)     Diet Order             Diet Heart Room service appropriate? Yes; Fluid consistency: Thin  Diet effective now                   Weight change:   Wt Readings from Last 3 Encounters:  01/08/21 111.1 kg  10/06/20 117.9 kg     Consultants:see note  Procedures:see note Antimicrobials: Anti-infectives (From admission, onward)    None      Culture/Microbiology No results found for: SDES, SPECREQUEST, CULT, REPTSTATUS  Other culture-see note  Unresulted Labs (From admission, onward)     Start     Ordered   01/15/21 0500  Creatinine, serum  (enoxaparin (LOVENOX)    CrCl >/= 30 ml/min)  Weekly,   R     Comments: while on enoxaparin therapy    01/08/21 1802           Data Reviewed: I have personally reviewed following labs and imaging studies CBC: Recent Labs  Lab 01/08/21 1355  WBC 8.3  NEUTROABS 6.6  HGB 12.5  HCT 39.8  MCV 84.1  PLT 350    Basic Metabolic Panel: Recent Labs  Lab 01/08/21 1355 01/08/21 2238 01/09/21 0840 01/10/21 0654 01/11/21 0145  NA 133* 134* 135 135 136  K 3.4* 3.3* 3.2* 3.6 3.7  CL 99 101 105 110 108  CO2 21* 20* 23 20* 23  GLUCOSE 111* 98 104* 105* 103*  BUN 13 7 6  5* <5*  CREATININE 1.10* 1.09* 1.10* 1.09* 1.06*  CALCIUM 8.2* 8.1* 7.9* 7.8* 8.7*  MG 2.2 1.9 2.3 1.9  --     GFR: Estimated Creatinine Clearance: 91.6 mL/min (A) (by C-G formula based on SCr of 1.06 mg/dL (H)). Liver Function Tests: Recent Labs  Lab 01/08/21 1355 01/09/21 0840 01/11/21 0145  AST 93* 104* 93*  ALT 67* 60* 60*  ALKPHOS 99 81 74  BILITOT 1.2 1.7* 1.2  PROT 7.8 6.3* 6.1*  ALBUMIN 4.1 3.1* 2.8*    No results for input(s): LIPASE, AMYLASE in the last 168 hours. No results for input(s): AMMONIA in the last 168 hours. Coagulation  Profile: No results for input(s): INR, PROTIME in the last 168 hours. Cardiac Enzymes: No results for input(s): CKTOTAL, CKMB, CKMBINDEX, TROPONINI in the last 168 hours. BNP (last 3 results) No results for input(s): PROBNP in the last 8760 hours. HbA1C: No results for input(s): HGBA1C in the last 72 hours. CBG: No results for input(s): GLUCAP in the last 168 hours. Lipid Profile: No results for input(s): CHOL, HDL, LDLCALC, TRIG, CHOLHDL, LDLDIRECT in the last 72 hours. Thyroid Function Tests: Recent Labs    01/11/21 0145  TSH 4.922*  FREET4 0.83   Anemia Panel: No results for input(s): VITAMINB12, FOLATE, FERRITIN, TIBC, IRON, RETICCTPCT in the last 72 hours. Sepsis Labs: No results for input(s): PROCALCITON, LATICACIDVEN in the last 168 hours.  Recent Results (from the past 240 hour(s))  Resp Panel by RT-PCR (Flu A&B, Covid) Nasopharyngeal  Swab     Status: None   Collection Time: 01/08/21  3:40 PM   Specimen: Nasopharyngeal Swab; Nasopharyngeal(NP) swabs in vial transport medium  Result Value Ref Range Status   SARS Coronavirus 2 by RT PCR NEGATIVE NEGATIVE Final    Comment: (NOTE) SARS-CoV-2 target nucleic acids are NOT DETECTED.  The SARS-CoV-2 RNA is generally detectable in upper respiratory specimens during the acute phase of infection. The lowest concentration of SARS-CoV-2 viral copies this assay can detect is 138 copies/mL. A negative result does not preclude SARS-Cov-2 infection and should not be used as the sole basis for treatment or other patient management decisions. A negative result may occur with  improper specimen collection/handling, submission of specimen other than nasopharyngeal swab, presence of viral mutation(s) within the areas targeted by this assay, and inadequate number of viral copies(<138 copies/mL). A negative result must be combined with clinical observations, patient history, and epidemiological information. The expected result is  Negative.  Fact Sheet for Patients:  EntrepreneurPulse.com.au  Fact Sheet for Healthcare Providers:  IncredibleEmployment.be  This test is no t yet approved or cleared by the [place] FDA and  has been authorized for detection and/or diagnosis of SARS-CoV-2 by FDA under an Emergency Use Authorization (EUA). This EUA will remain  in effect (meaning this test can be used) for the duration of the COVID-19 declaration under Section 564(b)(1) of the Act, 21 U.S.C.section 360bbb-3(b)(1), unless the authorization is terminated  or revoked sooner.       Influenza A by PCR NEGATIVE NEGATIVE Final   Influenza B by PCR NEGATIVE NEGATIVE Final    Comment: (NOTE) The Xpert Xpress SARS-CoV-2/FLU/RSV plus assay is intended as an aid in the diagnosis of influenza from Nasopharyngeal swab specimens and should not be used as a sole basis for treatment. Nasal washings and aspirates are unacceptable for Xpert Xpress SARS-CoV-2/FLU/RSV testing.  Fact Sheet for Patients: EntrepreneurPulse.com.au  Fact Sheet for Healthcare Providers: IncredibleEmployment.be  This test is not yet approved or cleared by the [place] FDA and has been authorized for detection and/or diagnosis of SARS-CoV-2 by FDA under an Emergency Use Authorization (EUA). This EUA will remain in effect (meaning this test can be used) for the duration of the COVID-19 declaration under Section 564(b)(1) of the Act, 21 U.S.C. section 360bbb-3(b)(1), unless the authorization is terminated or revoked.  Performed at [facility], [place] [address] [place], [place] [address]       Radiology Studies: EEG adult  Result Date: January 13, 2021 Lora Havens, MD     01/13/2021 12:16 PM Patient Name: Sharon Hanson MRN: [id number] Epilepsy Attending: Lora Havens Referring Physician/Provider: Dr Reubin Milan Date: Jan 13, 2021  Duration: 24.24 mins Patient history: 40yo F with seizure like episode. EEG to evaluate for seizure. Level of alertness: Awake AEDs during EEG study: None Technical aspects: This EEG study was done with scalp electrodes positioned according to the 10-20 International system of electrode placement. Electrical activity was acquired at a sampling rate of 500Hz  and reviewed with a high frequency filter of 70Hz  and a low frequency filter of 1Hz . EEG data were recorded continuously and digitally stored. Description: The posterior dominant rhythm consists of 9 Hz activity of moderate voltage (25-35 uV) seen predominantly in posterior head regions, symmetric and reactive to eye opening and eye closing. Hyperventilation and photic stimulation were not performed.   IMPRESSION: This study is within normal limits. No seizures or epileptiform discharges were seen throughout the recording. Priyanka Barbra Sarks  ECHOCARDIOGRAM COMPLETE  Result Date: 01/09/2021    ECHOCARDIOGRAM REPORT   Patient Name:   Sharon Hanson Date of Exam: 01/09/2021 Medical Rec #:  [id number]        Height:       67.0 in Accession #:    [id number]       Weight:       245.0 lb Date of Birth:  [birth year]        BSA:          2.204 m Patient Age:    57 years         BP:           154/99 mmHg Patient Gender: F                HR:           76 bpm. Exam Location:  Inpatient Procedure: 2D Echo, Color Doppler and Cardiac Doppler Indications:    R94.31 Abnormal EKG  History:        Patient has no prior history of Echocardiogram examinations.                 Risk Factors:Hypertension.  Sonographer:    Raquel Sarna Senior RDCS Referring Phys: [id number] [place]  1. Left ventricular ejection fraction, by estimation, is 55 to 60%. The left ventricle has normal function. The left ventricle has no regional wall motion abnormalities. There is mild concentric left ventricular hypertrophy. Left ventricular diastolic parameters are consistent with Grade I  diastolic dysfunction (impaired relaxation).  2. Right ventricular systolic function is normal. The right ventricular size is normal. There is normal pulmonary artery systolic pressure.  3. The mitral valve is normal in structure. Trivial mitral valve regurgitation. No evidence of mitral stenosis.  4. The aortic valve is tricuspid. Aortic valve regurgitation is not visualized. No aortic stenosis is present.  5. The inferior vena cava is normal in size with greater than 50% respiratory variability, suggesting right atrial pressure of 3 mmHg. FINDINGS  Left Ventricle: Left ventricular ejection fraction, by estimation, is 55 to 60%. The left ventricle has normal function. The left ventricle has no regional wall motion abnormalities. The left ventricular internal cavity size was normal in size. There is  mild concentric left ventricular hypertrophy. Left ventricular diastolic parameters are consistent with Grade I diastolic dysfunction (impaired relaxation). Normal left ventricular filling pressure. Right Ventricle: The right ventricular size is normal. No increase in right ventricular wall thickness. Right ventricular systolic function is normal. There is normal pulmonary artery systolic pressure. The tricuspid regurgitant velocity is 1.31 m/s, and  with an assumed right atrial pressure of 3 mmHg, the estimated right ventricular systolic pressure is 9.9 mmHg. Left Atrium: Left atrial size was normal in size. Right Atrium: Right atrial size was normal in size. Pericardium: There is no evidence of pericardial effusion. Mitral Valve: The mitral valve is normal in structure. Trivial mitral valve regurgitation. No evidence of mitral valve stenosis. Tricuspid Valve: The tricuspid valve is normal in structure. Tricuspid valve regurgitation is trivial. No evidence of tricuspid stenosis. Aortic Valve: The aortic valve is tricuspid. Aortic valve regurgitation is not visualized. No aortic stenosis is present. Pulmonic Valve: The  pulmonic valve was normal in structure. Pulmonic valve regurgitation is trivial. No evidence of pulmonic stenosis. Aorta: The aortic root is normal in size and structure. Venous: The inferior vena cava is normal in size with greater than 50% respiratory variability, suggesting right atrial pressure of 3 mmHg. IAS/Shunts:  No atrial level shunt detected by color flow Doppler.  LEFT VENTRICLE PLAX 2D LVIDd:         4.70 cm   Diastology LVIDs:         3.20 cm   LV e' medial:    5.87 cm/s LV PW:         1.20 cm   LV E/e' medial:  7.6 LV IVS:        1.10 cm   LV e' lateral:   6.85 cm/s LVOT diam:     2.00 cm   LV E/e' lateral: 6.5 LV SV:         46 LV SV Index:   21 LVOT Area:     3.14 cm  RIGHT VENTRICLE RV S prime:     14.90 cm/s TAPSE (M-mode): 2.0 cm LEFT ATRIUM             Index        RIGHT ATRIUM           Index LA diam:        3.70 cm 1.68 cm/m   RA Area:     18.40 cm LA Vol (A2C):   60.7 ml 27.54 ml/m  RA Volume:   55.80 ml  25.31 ml/m LA Vol (A4C):   55.4 ml 25.13 ml/m LA Biplane Vol: 62.5 ml 28.35 ml/m  AORTIC VALVE LVOT Vmax:   93.10 cm/s LVOT Vmean:  66.200 cm/s LVOT VTI:    0.148 m  AORTA Ao Root diam: 3.00 cm Ao Asc diam:  3.10 cm MITRAL VALVE               TRICUSPID VALVE MV Area (PHT): 2.57 cm    TR Peak grad:   6.9 mmHg MV Decel Time: 295 msec    TR Vmax:        131.00 cm/s MV E velocity: 44.60 cm/s MV A velocity: 48.50 cm/s  SHUNTS MV E/A ratio:  0.92        Systemic VTI:  0.15 m                            Systemic Diam: 2.00 cm Skeet Latch MD Electronically signed by Skeet Latch MD Signature Date/Time: 01/09/2021/6:36:19 PM    Final      LOS: 2 days   Antonieta Pert, MD [facility]  01/11/2021, 8:01 AM

## 2021-01-11 NOTE — Plan of Care (Signed)

## 2021-01-12 ENCOUNTER — Inpatient Hospital Stay (HOSPITAL_COMMUNITY): Payer: Self-pay

## 2021-01-12 ENCOUNTER — Inpatient Hospital Stay (HOSPITAL_COMMUNITY)
Admission: EM | Disposition: A | Discharge status: Home / Self Care | Payer: Self-pay | Source: Home / Self Care | Attending: Internal Medicine

## 2021-01-12 DIAGNOSIS — I471 Supraventricular tachycardia: Secondary | ICD-10-CM

## 2021-01-12 HISTORY — PX: ELECTROPHYSIOLOGY STUDY: EP1205

## 2021-01-12 HISTORY — PX: ICD IMPLANT: EP1208

## 2021-01-12 LAB — BASIC METABOLIC PANEL
Anion gap: 10 (ref 5–15)
BUN: 7 mg/dL (ref 6–20)
CO2: 22 mmol/L (ref 22–32)
Calcium: 8.9 mg/dL (ref 8.9–10.3)
Chloride: 106 mmol/L (ref 98–111)
Creatinine, Ser: 1.02 mg/dL — ABNORMAL HIGH (ref 0.44–1.00)
GFR, Estimated: >60 mL/min (ref >60)
Glucose, Bld: 95 mg/dL (ref 70–99)
Potassium: 4.1 mmol/L (ref 3.5–5.1)
Sodium: 138 mmol/L (ref 135–145)

## 2021-01-12 LAB — MAGNESIUM: Magnesium: 1.7 mg/dL (ref 1.7–2.4)

## 2021-01-12 IMAGING — MR MR CARD MORPHOLOGY WO/W CM
45 of 48 series · 45 of 48 positions shown · IV contrast (Contrast agent)
Comparison: none

CLINICAL DATA: Wide Complex tachycardia

EXAM:
CARDIAC MRI
TECHNIQUE: The patient was scanned on a 1.5 Tesla Siemens magnet. A dedicated
cardiac coil was used. Functional imaging was done using Fiesta
sequences. [DATE], and 4 chamber views were done to assess for RWMA's.
Modified EIO rule using a short axis stack was used to
calculate an ejection fraction on a dedicated work station using
Circle software. The patient received 10 cc of Gadavist. After 10
minutes inversion recovery sequences were used to assess for
infiltration and scar tissue.
CONTRAST:  Gadavist

[Series 4: t2_haste_db_tra_bh · axial · 8.0mm · 1.41mm/px · 1 of 16 slices shown]
[im 1/16]
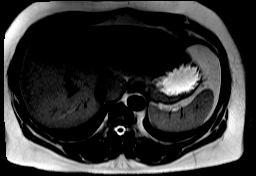

[Series 8: bSSFP · oblique · 8.0mm · 1.61mm/px · 1 of 25 slices shown (1 of 20)]
[im 1/25]
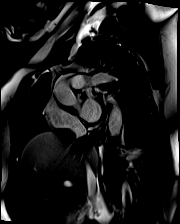

[Series 9: bSSFP · oblique · 8.0mm · 1.61mm/px · 1 of 25 slices shown (2 of 20)]
[im 1/25]
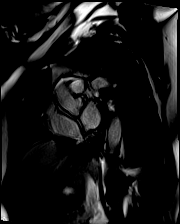

[Series 10: bSSFP · oblique · 8.0mm · 1.61mm/px · 1 of 25 slices shown (3 of 20)]
[im 1/25]
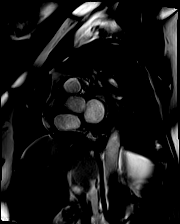

[Series 11: bSSFP · oblique · 8.0mm · 1.61mm/px · 1 of 25 slices shown (4 of 20)]
[im 1/25]
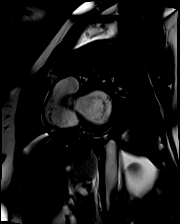

[Series 12: bSSFP · oblique · 8.0mm · 1.61mm/px · 1 of 25 slices shown (5 of 20)]
[im 1/25]
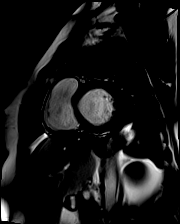

[Series 13: bSSFP · oblique · 8.0mm · 1.61mm/px · 1 of 25 slices shown (6 of 20)]
[im 1/25]
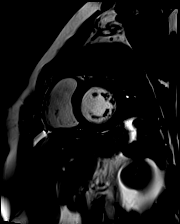

[Series 14: bSSFP · oblique · 8.0mm · 1.61mm/px · 1 of 25 slices shown (7 of 20)]
[im 1/25]
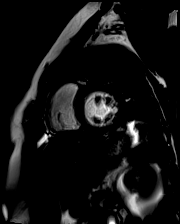

[Series 15: bSSFP · oblique · 8.0mm · 1.61mm/px · 1 of 25 slices shown (8 of 20)]
[im 1/25]
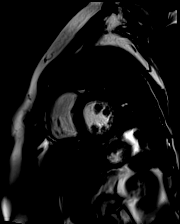

[Series 16: bSSFP · oblique · 8.0mm · 1.61mm/px · 1 of 25 slices shown (9 of 20)]
[im 1/25]
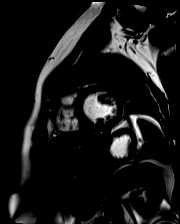

[Series 17: bSSFP · oblique · 8.0mm · 1.61mm/px · 1 of 25 slices shown (10 of 20)]
[im 1/25]
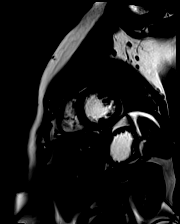

[Series 18: bSSFP · oblique · 8.0mm · 1.61mm/px · 1 of 25 slices shown (11 of 20)]
[im 1/25]
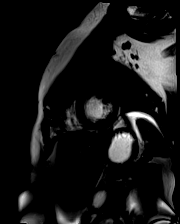

[Series 19: bSSFP · oblique · 8.0mm · 1.61mm/px · 1 of 25 slices shown (12 of 20)]
[im 1/25]
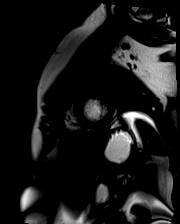

[Series 20: bSSFP · oblique · 8.0mm · 1.61mm/px · 1 of 25 slices shown (13 of 20)]
[im 1/25]
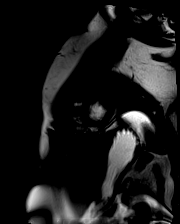

[Series 21: bSSFP · oblique · 8.0mm · 1.61mm/px · 1 of 25 slices shown (14 of 20)]
[im 1/25]
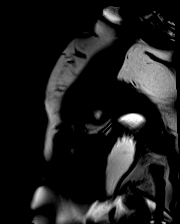

[Series 22: bSSFP · oblique · 8.0mm · 1.61mm/px · 1 of 25 slices shown (15 of 20)]
[im 1/25]
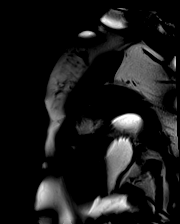

[Series 23: bSSFP · oblique · 8.0mm · 1.61mm/px · 1 of 25 slices shown (16 of 20)]
[im 1/25]
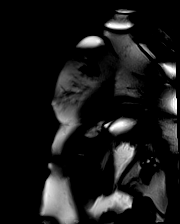

[Series 24: bSSFP · oblique · 8.0mm · 1.61mm/px · 1 of 25 slices shown (17 of 20)]
[im 1/25]
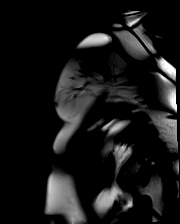

[Series 26: bSSFP · oblique · 6.0mm · 1.41mm/px · 1 of 25 slices shown (18 of 20)]
[im 1/25]
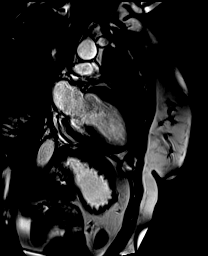

[Series 29: bSSFP · oblique · 6.0mm · 1.41mm/px · 1 of 25 slices shown (19 of 20)]
[im 1/25]
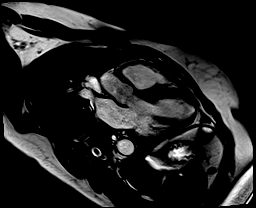

[Series 30: (id)_long_t1 · oblique · 8.0mm · 1.56mm/px · 1 of 24 slices shown]
[im 1/24]
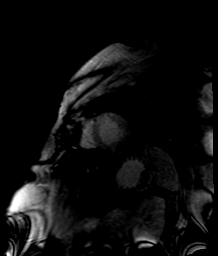

[Series 31: (id)_long_t1_moco · oblique · 8.0mm · 1.56mm/px · 1 of 24 slices shown]
[im 1/24]
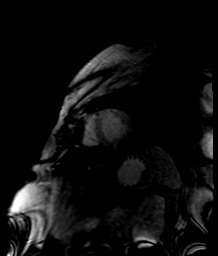

[Series 32: (id)_long_t1_moco_t1 · oblique · 8.0mm · 1.56mm/px · 1 of 6 slices shown]
[im 1/6]
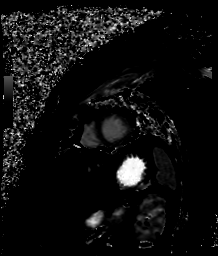

[Series 34: bSSFP · axial · 6.0mm · 1.41mm/px · 1 of 25 slices shown (20 of 20)]
[im 1/25]
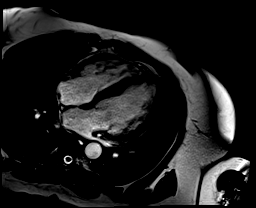

[Series 35: (id)_trufi · oblique · 8.0mm · 2.08mm/px · 1 of 9 slices shown]
[im 1/9]
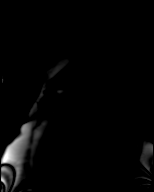

[Series 36: (id)_trufi_moco · oblique · 8.0mm · 2.08mm/px · 1 of 9 slices shown]
[im 1/9]
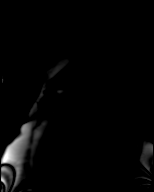

[Series 37: (id)_trufi_moco_t2 · oblique · 8.0mm · 2.08mm/px · 1 of 3 slices shown]
[im 1/3]
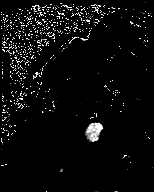

[Series 39: cine_trufi_cs_rt_short axis · oblique · 8.0mm · 1.73mm/px · 1 of 49 slices shown (1 of 18)]
[im 1/49]
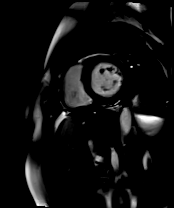

[Series 39: cine_trufi_cs_rt_short axis · oblique · 8.0mm · 1.73mm/px · 1 of 49 slices shown (2 of 18)]
[im 1/49]
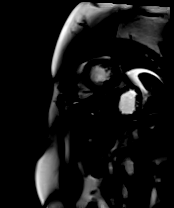

[Series 39: cine_trufi_cs_rt_short axis · oblique · 8.0mm · 1.73mm/px · 1 of 49 slices shown (3 of 18)]
[im 1/49]
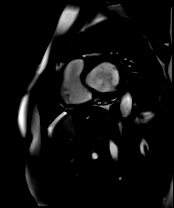

[Series 39: cine_trufi_cs_rt_short axis · oblique · 8.0mm · 1.73mm/px · 1 of 49 slices shown (4 of 18)]
[im 1/49]
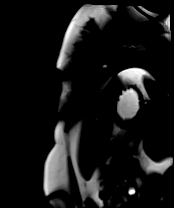

[Series 39: cine_trufi_cs_rt_short axis · oblique · 8.0mm · 1.73mm/px · 1 of 49 slices shown (5 of 18)]
[im 1/49]
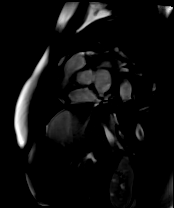

[Series 39: cine_trufi_cs_rt_short axis · oblique · 8.0mm · 1.73mm/px · 1 of 49 slices shown (6 of 18)]
[im 1/49]
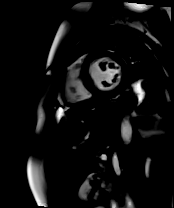

[Series 39: cine_trufi_cs_rt_short axis · oblique · 8.0mm · 1.73mm/px · 1 of 49 slices shown (7 of 18)]
[im 1/49]
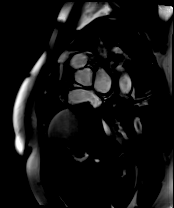

[Series 39: cine_trufi_cs_rt_short axis · oblique · 8.0mm · 1.73mm/px · 1 of 49 slices shown (8 of 18)]
[im 1/49]
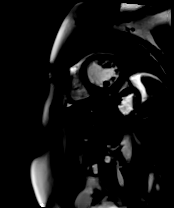

[Series 39: cine_trufi_cs_rt_short axis · oblique · 8.0mm · 1.73mm/px · 1 of 49 slices shown (9 of 18)]
[im 1/49]
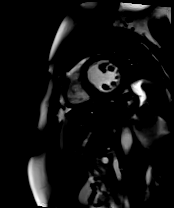

[Series 39: cine_trufi_cs_rt_short axis · oblique · 8.0mm · 1.73mm/px · 1 of 49 slices shown (10 of 18)]
[im 1/49]
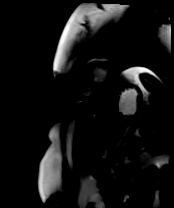

[Series 39: cine_trufi_cs_rt_short axis · oblique · 8.0mm · 1.73mm/px · 1 of 49 slices shown (11 of 18)]
[im 1/49]
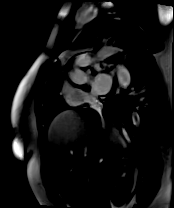

[Series 39: cine_trufi_cs_rt_short axis · oblique · 8.0mm · 1.73mm/px · 1 of 49 slices shown (12 of 18)]
[im 1/49]
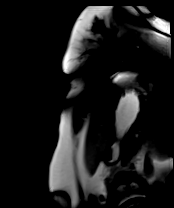

[Series 39: cine_trufi_cs_rt_short axis · oblique · 8.0mm · 1.73mm/px · 1 of 49 slices shown (13 of 18)]
[im 1/49]
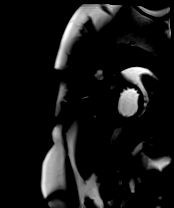

[Series 39: cine_trufi_cs_rt_short axis · oblique · 8.0mm · 1.73mm/px · 1 of 49 slices shown (14 of 18)]
[im 1/49]
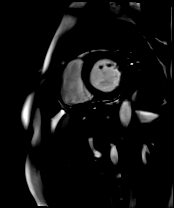

[Series 39: cine_trufi_cs_rt_short axis · oblique · 8.0mm · 1.73mm/px · 1 of 49 slices shown (15 of 18)]
[im 1/49]
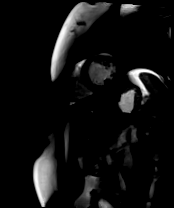

[Series 39: cine_trufi_cs_rt_short axis · oblique · 8.0mm · 1.73mm/px · 1 of 49 slices shown (16 of 18)]
[im 1/49]
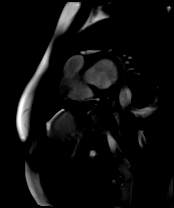

[Series 39: cine_trufi_cs_rt_short axis · oblique · 8.0mm · 1.73mm/px · 1 of 49 slices shown (17 of 18)]
[im 1/49]
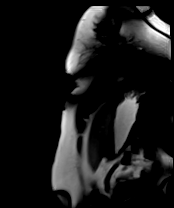

[Series 39: cine_trufi_cs_rt_short axis · oblique · 8.0mm · 1.73mm/px · 1 of 49 slices shown (18 of 18)]
[im 1/49]
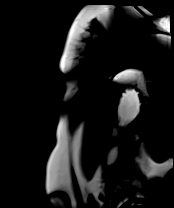

[45 of 48 positions shown; findings below may reference images not displayed]

FINDINGS: Normal cardiac chamber sizes. Normal ascending aortic root 3.0 cm
Trivial LV posterior pericardial effusion No ASD/PFO. Normal cardiac
valves Normal LV size and function No RWMA's Septal thickness 11 mm
Quantitative EF 53% (EDV 193 cc ESV 91 cc SV 102 cc) Normal RV size
and function no evidence of diverticula or RV dysplasia. No
gadolinium uptake in the delayed inversion recovery sequences.
Normal nulling of the LV myocardium Minimally elevated ECV 32%
Normal T1 925 msec and normal T2 43 msec
IMPRESSION: 1.  Normal LV size and function EF 53%

2.  Normal RV size and function no dysplasia

3.  Normal parametric measures

4.  No delayed gadolinium uptake

5.  Trivial LV posterior pericardial effusion

6.  Normal cardiac valves

7.  Normal ascending aortic root 3.0 cm

EIO

## 2021-01-12 IMAGING — MR MR CARD MORPHOLOGY WO/W CM
45 of 48 series · 45 of 48 positions shown · IV contrast (gadavist)
Comparison: none

CLINICAL DATA: Wide Complex tachycardia

EXAM:
CARDIAC MRI
TECHNIQUE: The patient was scanned on a 1.5 Tesla Siemens magnet. A dedicated
cardiac coil was used. Functional imaging was done using Fiesta
sequences. [DATE], and 4 chamber views were done to assess for RWMA's.
Modified EIO rule using a short axis stack was used to
calculate an ejection fraction on a dedicated work station using
Circle software. The patient received 10 cc of Gadavist. After 10
minutes inversion recovery sequences were used to assess for
infiltration and scar tissue.
CONTRAST:  Gadavist

[Series 4: t2_haste_db_tra_bh · axial · 8.0mm · 1.41mm/px · 1 of 16 slices shown]
[im 1/16]
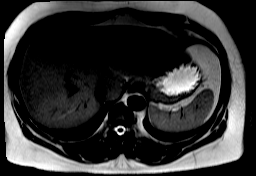

[Series 8: bSSFP · oblique · 8.0mm · 1.61mm/px · 1 of 25 slices shown (1 of 20)]
[im 1/25]
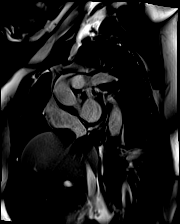

[Series 9: bSSFP · oblique · 8.0mm · 1.61mm/px · 1 of 25 slices shown (2 of 20)]
[im 1/25]
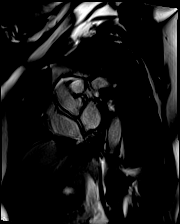

[Series 10: bSSFP · oblique · 8.0mm · 1.61mm/px · 1 of 25 slices shown (3 of 20)]
[im 1/25]
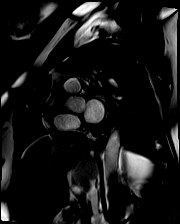

[Series 11: bSSFP · oblique · 8.0mm · 1.61mm/px · 1 of 25 slices shown (4 of 20)]
[im 1/25]
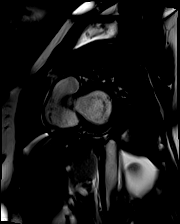

[Series 12: bSSFP · oblique · 8.0mm · 1.61mm/px · 1 of 25 slices shown (5 of 20)]
[im 1/25]
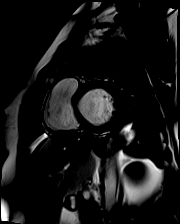

[Series 13: bSSFP · oblique · 8.0mm · 1.61mm/px · 1 of 25 slices shown (6 of 20)]
[im 1/25]
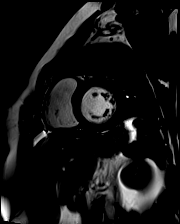

[Series 14: bSSFP · oblique · 8.0mm · 1.61mm/px · 1 of 25 slices shown (7 of 20)]
[im 1/25]
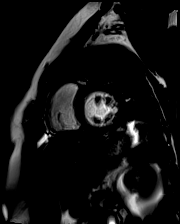

[Series 15: bSSFP · oblique · 8.0mm · 1.61mm/px · 1 of 25 slices shown (8 of 20)]
[im 1/25]
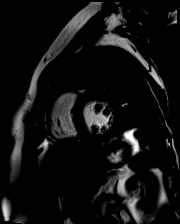

[Series 16: bSSFP · oblique · 8.0mm · 1.61mm/px · 1 of 25 slices shown (9 of 20)]
[im 1/25]
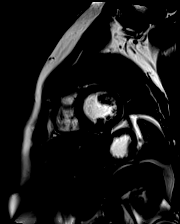

[Series 17: bSSFP · oblique · 8.0mm · 1.61mm/px · 1 of 25 slices shown (10 of 20)]
[im 1/25]
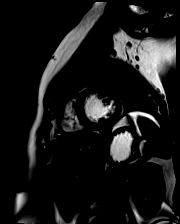

[Series 18: bSSFP · oblique · 8.0mm · 1.61mm/px · 1 of 25 slices shown (11 of 20)]
[im 1/25]
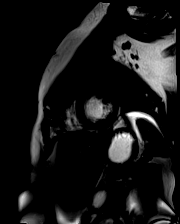

[Series 19: bSSFP · oblique · 8.0mm · 1.61mm/px · 1 of 25 slices shown (12 of 20)]
[im 1/25]
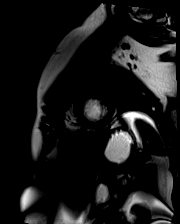

[Series 20: bSSFP · oblique · 8.0mm · 1.61mm/px · 1 of 25 slices shown (13 of 20)]
[im 1/25]
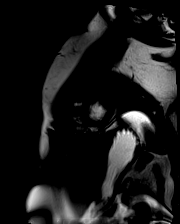

[Series 21: bSSFP · oblique · 8.0mm · 1.61mm/px · 1 of 25 slices shown (14 of 20)]
[im 1/25]
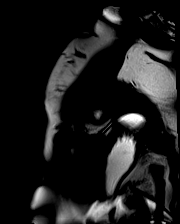

[Series 22: bSSFP · oblique · 8.0mm · 1.61mm/px · 1 of 25 slices shown (15 of 20)]
[im 1/25]
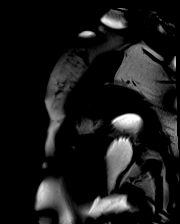

[Series 23: bSSFP · oblique · 8.0mm · 1.61mm/px · 1 of 25 slices shown (16 of 20)]
[im 1/25]
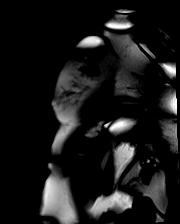

[Series 24: bSSFP · oblique · 8.0mm · 1.61mm/px · 1 of 25 slices shown (17 of 20)]
[im 1/25]
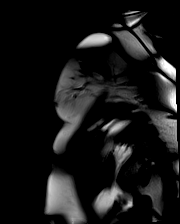

[Series 26: bSSFP · oblique · 6.0mm · 1.41mm/px · 1 of 25 slices shown (18 of 20)]
[im 1/25]
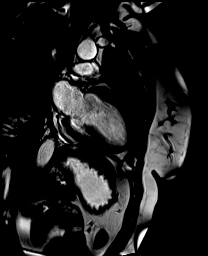

[Series 29: bSSFP · oblique · 6.0mm · 1.41mm/px · 1 of 25 slices shown (19 of 20)]
[im 1/25]
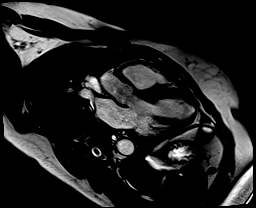

[Series 30: (id)_long_t1 · oblique · 8.0mm · 1.56mm/px · 1 of 24 slices shown]
[im 1/24]
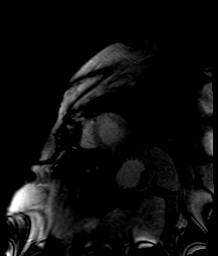

[Series 31: (id)_long_t1_moco · oblique · 8.0mm · 1.56mm/px · 1 of 24 slices shown]
[im 1/24]
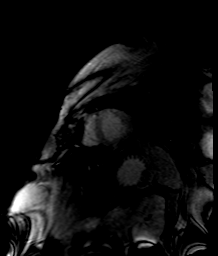

[Series 32: (id)_long_t1_moco_t1 · oblique · 8.0mm · 1.56mm/px · 1 of 6 slices shown]
[im 1/6]
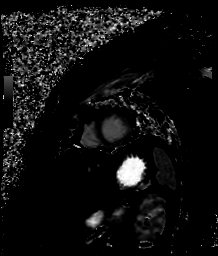

[Series 34: bSSFP · axial · 6.0mm · 1.41mm/px · 1 of 25 slices shown (20 of 20)]
[im 1/25]
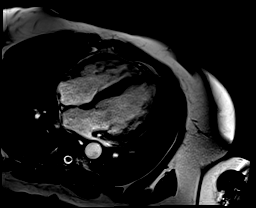

[Series 35: (id)_trufi · oblique · 8.0mm · 2.08mm/px · 1 of 9 slices shown]
[im 1/9]
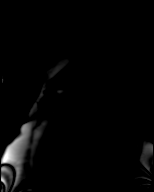

[Series 36: (id)_trufi_moco · oblique · 8.0mm · 2.08mm/px · 1 of 9 slices shown]
[im 1/9]
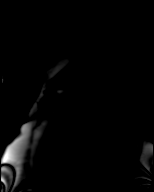

[Series 37: (id)_trufi_moco_t2 · oblique · 8.0mm · 2.08mm/px · 1 of 3 slices shown]
[im 1/3]
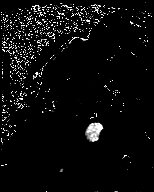

[Series 39: cine_trufi_cs_rt_short axis · oblique · 8.0mm · 1.73mm/px · 1 of 49 slices shown (1 of 18)]
[im 1/49]
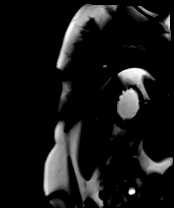

[Series 39: cine_trufi_cs_rt_short axis · oblique · 8.0mm · 1.73mm/px · 1 of 49 slices shown (2 of 18)]
[im 1/49]
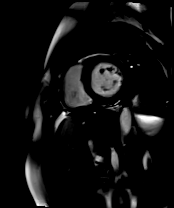

[Series 39: cine_trufi_cs_rt_short axis · oblique · 8.0mm · 1.73mm/px · 1 of 49 slices shown (3 of 18)]
[im 1/49]
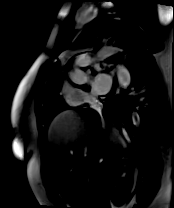

[Series 39: cine_trufi_cs_rt_short axis · oblique · 8.0mm · 1.73mm/px · 1 of 49 slices shown (4 of 18)]
[im 1/49]
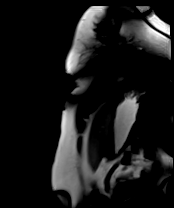

[Series 39: cine_trufi_cs_rt_short axis · oblique · 8.0mm · 1.73mm/px · 1 of 49 slices shown (5 of 18)]
[im 1/49]
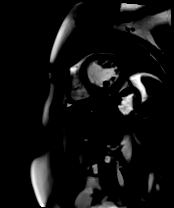

[Series 39: cine_trufi_cs_rt_short axis · oblique · 8.0mm · 1.73mm/px · 1 of 49 slices shown (6 of 18)]
[im 1/49]
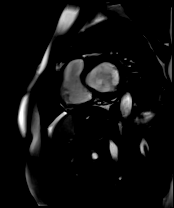

[Series 39: cine_trufi_cs_rt_short axis · oblique · 8.0mm · 1.73mm/px · 1 of 49 slices shown (7 of 18)]
[im 1/49]
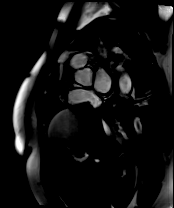

[Series 39: cine_trufi_cs_rt_short axis · oblique · 8.0mm · 1.73mm/px · 1 of 49 slices shown (8 of 18)]
[im 1/49]
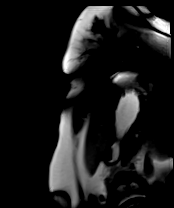

[Series 39: cine_trufi_cs_rt_short axis · oblique · 8.0mm · 1.73mm/px · 1 of 49 slices shown (9 of 18)]
[im 1/49]
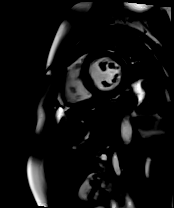

[Series 39: cine_trufi_cs_rt_short axis · oblique · 8.0mm · 1.73mm/px · 1 of 49 slices shown (10 of 18)]
[im 1/49]
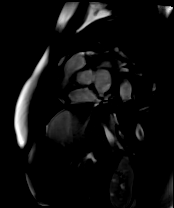

[Series 39: cine_trufi_cs_rt_short axis · oblique · 8.0mm · 1.73mm/px · 1 of 49 slices shown (11 of 18)]
[im 1/49]
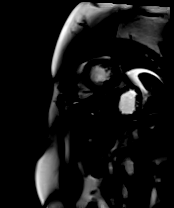

[Series 39: cine_trufi_cs_rt_short axis · oblique · 8.0mm · 1.73mm/px · 1 of 49 slices shown (12 of 18)]
[im 1/49]
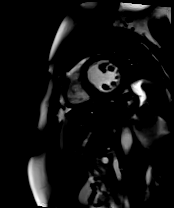

[Series 39: cine_trufi_cs_rt_short axis · oblique · 8.0mm · 1.73mm/px · 1 of 49 slices shown (13 of 18)]
[im 1/49]
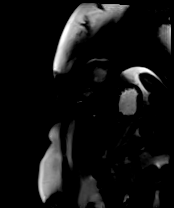

[Series 39: cine_trufi_cs_rt_short axis · oblique · 8.0mm · 1.73mm/px · 1 of 49 slices shown (14 of 18)]
[im 1/49]
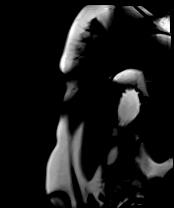

[Series 39: cine_trufi_cs_rt_short axis · oblique · 8.0mm · 1.73mm/px · 1 of 49 slices shown (15 of 18)]
[im 1/49]
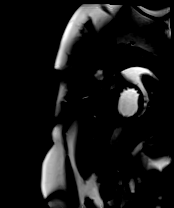

[Series 39: cine_trufi_cs_rt_short axis · oblique · 8.0mm · 1.73mm/px · 1 of 49 slices shown (16 of 18)]
[im 1/49]
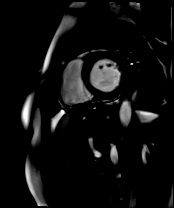

[Series 39: cine_trufi_cs_rt_short axis · oblique · 8.0mm · 1.73mm/px · 1 of 49 slices shown (17 of 18)]
[im 1/49]
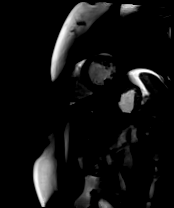

[Series 39: cine_trufi_cs_rt_short axis · oblique · 8.0mm · 1.73mm/px · 1 of 49 slices shown (18 of 18)]
[im 1/49]
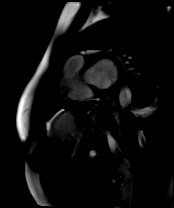

[45 of 48 positions shown; findings below may reference images not displayed]

FINDINGS: Normal cardiac chamber sizes. Normal ascending aortic root 3.0 cm
Trivial LV posterior pericardial effusion No ASD/PFO. Normal cardiac
valves Normal LV size and function No RWMA's Septal thickness 11 mm
Quantitative EF 53% (EDV 193 cc ESV 91 cc SV 102 cc) Normal RV size
and function no evidence of diverticula or RV dysplasia. No
gadolinium uptake in the delayed inversion recovery sequences.
Normal nulling of the LV myocardium Minimally elevated ECV 32%
Normal T1 925 msec and normal T2 43 msec
IMPRESSION: 1.  Normal LV size and function EF 53%

2.  Normal RV size and function no dysplasia

3.  Normal parametric measures

4.  No delayed gadolinium uptake

5.  Trivial LV posterior pericardial effusion

6.  Normal cardiac valves

7.  Normal ascending aortic root 3.0 cm

EIO

## 2021-01-12 SURGERY — ELECTROPHYSIOLOGY STUDY

## 2021-01-12 MED ORDER — CEFAZOLIN SODIUM-DEXTROSE 2-4 GM/100ML-% IV SOLN
INTRAVENOUS | Status: AC
  Filled 2021-01-12: qty 100

## 2021-01-12 MED ORDER — LIDOCAINE HCL 1 % IJ SOLN
INTRAMUSCULAR | Status: AC
  Filled 2021-01-12: qty 20

## 2021-01-12 MED ORDER — HEPARIN (PORCINE) IN NACL 1000-0.9 UT/500ML-% IV SOLN
INTRAVENOUS | Status: DC | PRN
  Administered 2021-01-12: 500 mL

## 2021-01-12 MED ORDER — SODIUM CHLORIDE 0.9% FLUSH
3.0000 mL | Freq: Two times a day (BID) | INTRAVENOUS | Status: DC
  Administered 2021-01-12 – 2021-01-13 (×2): 3 mL via INTRAVENOUS

## 2021-01-12 MED ORDER — SODIUM CHLORIDE 0.9 % IV SOLN: INTRAVENOUS | Status: DC

## 2021-01-12 MED ORDER — FENTANYL CITRATE (PF) 100 MCG/2ML IJ SOLN
INTRAMUSCULAR | Status: AC
  Filled 2021-01-12: qty 2

## 2021-01-12 MED ORDER — ISOPROTERENOL HCL 0.2 MG/ML IJ SOLN
INTRAVENOUS | Status: AC | PRN
  Administered 2021-01-12: 2 ug/min via INTRAVENOUS

## 2021-01-12 MED ORDER — CEFAZOLIN SODIUM-DEXTROSE 2-4 GM/100ML-% IV SOLN
2.0000 g | INTRAVENOUS | Status: AC
  Administered 2021-01-12: 2 g via INTRAVENOUS
  Filled 2021-01-12: qty 100

## 2021-01-12 MED ORDER — GADOBUTROL 1 MMOL/ML IV SOLN
10.0000 mL | Freq: Once | INTRAVENOUS | Status: AC | PRN
  Administered 2021-01-12: 10 mL via INTRAVENOUS

## 2021-01-12 MED ORDER — HYDRALAZINE HCL 20 MG/ML IJ SOLN
INTRAMUSCULAR | Status: AC
  Filled 2021-01-12: qty 1

## 2021-01-12 MED ORDER — HEPARIN (PORCINE) IN NACL 1000-0.9 UT/500ML-% IV SOLN
INTRAVENOUS | Status: AC
  Filled 2021-01-12: qty 500

## 2021-01-12 MED ORDER — ONDANSETRON HCL 4 MG/2ML IJ SOLN: 4.0000 mg | Freq: Four times a day (QID) | INTRAMUSCULAR | Status: DC | PRN

## 2021-01-12 MED ORDER — CHLORHEXIDINE GLUCONATE 4 % EX LIQD: 60.0000 mL | Freq: Once | CUTANEOUS | Status: DC

## 2021-01-12 MED ORDER — SODIUM CHLORIDE 0.9 % IV SOLN
INTRAVENOUS | Status: AC
  Filled 2021-01-12: qty 2

## 2021-01-12 MED ORDER — MIDAZOLAM HCL 5 MG/5ML IJ SOLN
INTRAMUSCULAR | Status: AC
  Filled 2021-01-12: qty 5

## 2021-01-12 MED ORDER — SODIUM CHLORIDE 0.9% FLUSH: 3.0000 mL | INTRAVENOUS | Status: DC | PRN

## 2021-01-12 MED ORDER — CEFAZOLIN SODIUM-DEXTROSE 1-4 GM/50ML-% IV SOLN
1.0000 g | Freq: Four times a day (QID) | INTRAVENOUS | Status: DC
  Administered 2021-01-12 – 2021-01-13 (×2): 1 g via INTRAVENOUS
  Filled 2021-01-12 (×3): qty 50

## 2021-01-12 MED ORDER — LORAZEPAM 0.5 MG PO TABS: 0.5000 mg | ORAL_TABLET | ORAL | Status: DC | PRN

## 2021-01-12 MED ORDER — ISOPROTERENOL HCL 0.2 MG/ML IJ SOLN
INTRAMUSCULAR | Status: AC
  Filled 2021-01-12: qty 5

## 2021-01-12 MED ORDER — LIDOCAINE HCL 1 % IJ SOLN
INTRAMUSCULAR | Status: AC
  Filled 2021-01-12: qty 60

## 2021-01-12 MED ORDER — SODIUM CHLORIDE 0.9 % IV SOLN
80.0000 mg | INTRAVENOUS | Status: AC
  Administered 2021-01-12: 80 mg
  Filled 2021-01-12: qty 2

## 2021-01-12 MED ORDER — SODIUM CHLORIDE 0.9 % IV SOLN
250.0000 mL | INTRAVENOUS | Status: DC | PRN
  Administered 2021-01-12: 250 mL via INTRAVENOUS

## 2021-01-12 MED ORDER — SODIUM CHLORIDE 0.9% FLUSH
3.0000 mL | Freq: Two times a day (BID) | INTRAVENOUS | Status: DC
  Administered 2021-01-13: 3 mL via INTRAVENOUS

## 2021-01-12 MED ORDER — SODIUM CHLORIDE 0.9 % IV SOLN: 250.0000 mL | INTRAVENOUS | Status: DC

## 2021-01-12 MED ORDER — MIDAZOLAM HCL 5 MG/5ML IJ SOLN
INTRAMUSCULAR | Status: DC | PRN
  Administered 2021-01-12 (×6): 1 mg via INTRAVENOUS
  Administered 2021-01-12: 2 mg via INTRAVENOUS

## 2021-01-12 MED ORDER — HYDRALAZINE HCL 20 MG/ML IJ SOLN
INTRAMUSCULAR | Status: DC | PRN
  Administered 2021-01-12 (×2): 5 mg via INTRAVENOUS

## 2021-01-12 MED ORDER — LIDOCAINE HCL (PF) 1 % IJ SOLN
INTRAMUSCULAR | Status: DC | PRN
  Administered 2021-01-12 (×2): 60 mL

## 2021-01-12 MED ORDER — BUPIVACAINE HCL (PF) 0.25 % IJ SOLN
INTRAMUSCULAR | Status: AC
  Filled 2021-01-12: qty 30

## 2021-01-12 MED ORDER — IOHEXOL 350 MG/ML SOLN
INTRAVENOUS | Status: DC | PRN
  Administered 2021-01-12: 15 mL

## 2021-01-12 MED ORDER — FENTANYL CITRATE (PF) 100 MCG/2ML IJ SOLN
INTRAMUSCULAR | Status: DC | PRN
  Administered 2021-01-12 (×7): 25 ug via INTRAVENOUS

## 2021-01-12 SURGICAL SUPPLY — 28 items
CABLE SURGICAL S-101-97-12 (CABLE) ×2 IMPLANT
CATH DECANAV F CURVE (CATHETERS) ×1 IMPLANT
CATH DUODECA HALO/ISMUS 7FR (CATHETERS) ×2 IMPLANT
CATH HEX JOS 2-5-2 65CM 6F REP (CATHETERS) ×1 IMPLANT
CATH JOSEPH QUAD ALLRED 6F REP (CATHETERS) ×2 IMPLANT
CATH SMTCH THERMOCOOL SF DF (CATHETERS) ×1 IMPLANT
CATH SOUNDSTAR ECO 8FR (CATHETERS) ×1 IMPLANT
CATH WEB BI DIR CSDF CRV REPRO (CATHETERS) ×1 IMPLANT
CLOSURE PERCLOSE PROSTYLE (VASCULAR PRODUCTS) ×4 IMPLANT
ICD EVERA DR XT MRI DDMB1D4 (ICD Generator) ×1 IMPLANT
LEAD CAPSURE NOVUS 5076-52CM (Lead) ×1 IMPLANT
LEAD SPRINT QUAT SEC 6935M-62 (Lead) ×1 IMPLANT
MAT PREVALON FULL STRYKER (MISCELLANEOUS) ×1 IMPLANT
PACK EP LATEX FREE (CUSTOM PROCEDURE TRAY) ×1
PACK EP LF (CUSTOM PROCEDURE TRAY) ×1 IMPLANT
PAD DEFIB RADIO PHYSIO CONN (PAD) ×2 IMPLANT
PATCH CARTO3 (PAD) ×1 IMPLANT
POUCH AIGIS-R ANTIBACT ICD (Mesh General) ×2 IMPLANT
POUCH AIGIS-R ANTIBACT ICD LRG (Mesh General) IMPLANT
SHEATH 7FR PRELUDE SNAP 13 (SHEATH) ×1 IMPLANT
SHEATH 9FR PRELUDE SNAP 13 (SHEATH) ×1 IMPLANT
SHEATH PINNACLE 6F 10CM (SHEATH) ×2 IMPLANT
SHEATH PINNACLE 7F 10CM (SHEATH) ×2 IMPLANT
SHEATH PINNACLE 8F 10CM (SHEATH) ×1 IMPLANT
SHEATH PINNACLE 9F 10CM (SHEATH) ×1 IMPLANT
SHEATH PROBE COVER 6X72 (BAG) ×3 IMPLANT
TRAY PACEMAKER INSERTION (PACKS) ×2 IMPLANT
TUBING SMART ABLATE COOLFLOW (TUBING) ×1 IMPLANT

## 2021-01-12 NOTE — Progress Notes (Signed)
MRI attempted on pt this morning.  Pt seemed very anxious going into scanner. Pt not cooperating with breath holds and c/o being dizzy and stated she wish to abort scan.  Scan can be attempted again with meds at 10am or be done tomorrow.  Please call MRI Dept with any questions.

## 2021-01-12 NOTE — Progress Notes (Addendum)
Right neck vascular site, sheath removed by brian, supervisor, manual pressure applied for approximately 5-6 minutes, vaseline gauze and tegaderm noted, no bleeding present, safety maintained  Pt noted to have swollen area to right forearm, pt states she had an IV infiltrate when she 1st came to hospital, pinkness and tenderness noted  Pt on menstrual cycle, peri care provided, linens changed, pad placed

## 2021-01-12 NOTE — Progress Notes (Addendum)
Progress Note  Patient Name: Sharon Hanson Date of Encounter: 01/12/2021  [facility] Cardiologist: None   Subjective   Currently feeling well without complaint.  Inpatient Medications    Scheduled Meds:  calcium carbonate  1 tablet Oral TID   Chlorhexidine Gluconate Cloth  6 each Topical Q0600   levETIRAcetam  500 mg Oral BID   magnesium oxide  400 mg Oral BID   metoprolol tartrate  25 mg Oral BID   mupirocin ointment  1 application Nasal BID   potassium chloride  20 mEq Oral Daily   Ensure Max Protein  11 oz Oral BID   sodium chloride flush  3 mL Intravenous Q12H   Continuous Infusions:   PRN Meds: acetaminophen **OR** acetaminophen, hydrALAZINE, LORazepam, LORazepam   Vital Signs    Vitals:   01/11/21 2020 01/11/21 2338 01/12/21 0307 01/12/21 0840  BP: (!) 167/100 (!) 168/100 (!) 142/99 (!) 159/97  Pulse: 87 75 71 68  Resp: 16 18 20 16   Temp: 98.2 F (36.8 C) 98.4 F (36.9 C) 97.9 F (36.6 C) 98 F (36.7 C)  TempSrc: Oral Oral Oral   SpO2: 100% 100% 100% 100%  Weight:      Height:       No intake or output data in the 24 hours ending 01/12/21 0920  Last 3 Weights 01/08/2021 01/08/2021 10/06/2020  Weight (lbs) 245 lb 245 lb 260 lb  Weight (kg) 111.131 kg 111.131 kg 117.935 kg      Telemetry    Sinus rhythm occ PVCs- Personally Reviewed  ECG    None new- Personally Reviewed  Physical Exam   GEN: No acute distress.   Neck: No JVD Cardiac: RRR, no murmurs, rubs, or gallops.  Respiratory: CTA b/l. GI: Soft, nontender, non-distended  MS: No edema; No deformity. Neuro:  Nonfocal  Psych: Normal affect   Labs    High Sensitivity Troponin:  No results for input(s): TROPONINIHS in the last 720 hours.   Chemistry Recent Labs  Lab 01/08/21 1355 01/08/21 2238 01/09/21 0840 01/10/21 0654 01/11/21 0145 01/12/21 0256  NA 133*   < > 135 135 136 138  K 3.4*   < > 3.2* 3.6 3.7 4.1  CL 99   < > 105 110 108 106  CO2 21*   < > 23 20* 23 22   GLUCOSE 111*   < > 104* 105* 103* 95  BUN 13   < > 6 5* <5* 7  CREATININE 1.10*   < > 1.10* 1.09* 1.06* 1.02*  CALCIUM 8.2*   < > 7.9* 7.8* 8.7* 8.9  MG 2.2   < > 2.3 1.9  --  1.7  PROT 7.8  --  6.3*  --  6.1*  --   ALBUMIN 4.1  --  3.1*  --  2.8*  --   AST 93*  --  104*  --  93*  --   ALT 67*  --  60*  --  60*  --   ALKPHOS 99  --  81  --  74  --   BILITOT 1.2  --  1.7*  --  1.2  --   GFRNONAA >60   < > >60 >60 >60 >60  ANIONGAP 13   < > 7 5 5 10    < > = values in this interval not displayed.    Lipids No results for input(s): CHOL, TRIG, HDL, LABVLDL, LDLCALC, CHOLHDL in the last 168 hours.  Hematology Recent Labs  Lab 01/08/21 1355  WBC 8.3  RBC 4.73  HGB 12.5  HCT 39.8  MCV 84.1  MCH 26.4  MCHC 31.4  RDW 21.2*  PLT 350   Thyroid  Recent Labs  Lab 01/11/21 0145  TSH 4.922*  FREET4 0.83    BNPNo results for input(s): BNP, PROBNP in the last 168 hours.  DDimer No results for input(s): DDIMER in the last 168 hours.   Radiology    No results found.  Cardiac Studies   TTE  1. Left ventricular ejection fraction, by estimation, is 55 to 60%. The  left ventricle has normal function. The left ventricle has no regional  wall motion abnormalities. There is mild concentric left ventricular  hypertrophy. Left ventricular diastolic  parameters are consistent with Grade I diastolic dysfunction (impaired  relaxation).   2. Right ventricular systolic function is normal. The right ventricular  size is normal. There is normal pulmonary artery systolic pressure.   3. The mitral valve is normal in structure. Trivial mitral valve  regurgitation. No evidence of mitral stenosis.   4. The aortic valve is tricuspid. Aortic valve regurgitation is not  visualized. No aortic stenosis is present.   5. The inferior vena cava is normal in size with greater than 50%  respiratory variability, suggesting right atrial pressure of 3 mmHg.   Patient Profile     40 y.o. 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  Also found to have a wide-complex tachycardia.  Assessment & Plan    Wide-complex tachycardia:  Tachycardia appears to be due to ventricular tachycardia, though with a normal echo and her age,   Dr. Elberta Fortis felt she would benefit from EP study.    At EP study, if she has an atrial arrhythmia or accessory pathway, we Kinzi Frediani plan for ablation.  If nothing is induced, Elane Peabody likely need an ICD.    MRI interrupted 2/2 pt anxiety Planned to bring back down shortly with ativan on board  Discussed planned/potential procedures today, she did say that Dr. Elberta Fortis spoke with her yesterday about the planned procedures, aware of possible ICD implant Answered her f/u questions. She remains agreeable to proceed  For questions or updates, please contact [facility] Please consult [email or web address] for contact info under        Signed, Sheilah Pigeon, PA-C  01/12/2021, 9:20 AM    I have seen and examined this patient with Francis Dowse.  Agree with above, note added to reflect my findings.  On exam, RRR, no murmurs, lungs clear, no edema, no JVD.  No further episodes of wide-complex tachycardia.  Patient is feeling well today.  Patient does tell me that she has had palpitations and rapid heart rates in the past, though this has not been followed up by cardiology.  MRI scheduled for earlier today, but the patient had issues with anxiety.  We Luqman Perrelli retry her on 10:00 this morning.  After the MRI, we Reggie Bise plan for EP study and if an atrial arrhythmia or accessory pathway we Arwyn Besaw plan for ablation.  If no accessory pathway, the patient Kevina Piloto need ICD implant.  Nathan Moctezuma M. Dayden Viverette MD 01/12/2021 9:55 AM

## 2021-01-12 NOTE — Progress Notes (Signed)
PROGRESS NOTE    Sharon Hanson  DZH:[id number] DOB: March 26, 1980 DOA: 01/08/2021 PCP: Pcp, No   Chief Complaint  Patient presents with   Seizures   Brief Narrative/Hospital Course:  Sharon Hanson, 40 y.o. female with PMH of hypertension obesity episode described as strong diet likely seizure did not have any work-up for doctor visit brought to the ED after having seizure episode at work.In the ED while getting CT scan set on the episode of G TCS was monosaturated did not remember the for MRI.  She is admitted for further management.  She was given Keppra.  Following postictal state and tongue biting she was found to be tachycardic with heart rate into 240s-cardiology was also consulted concerned about  V. tach placed on amiodarone. Patient was transferred to [facility] and seen by neurology and cardiology. Seen by EP.  Subjective: Seen and examined this morning.  Resting comfortably.  She has no headache no chest pain or shortness of breath. Afebrile overnight.  Blood pressure 140s to 160s Unable to complete MRI this morning  Assessment & Plan:  WCT/PVCs: echo -Normal function, G1 DD, normal mitral valve aortic valve further work up-plan for cardiac MRI and possible ICD placement today remains n.p.o.  Off amiodarone.  Continue metoprolol. TSH slightly abnormal but free T4 normal.  Electrolytes improved.  We will add ativan for MRI.  Seizures, generalized convulsive: With previous history of for seizure x1, and presented with seizure episodes x2- MRI - partially empty sella.  Underwent EEG and neurology evaluation, recommending to continue with Keppra 500 twice daily, nondriving x6 months and I will follow-up with neurology outpatient.  Continue seizure precaution.    Hypertension: Fairly stable continue metoprolol  adjust meds per cardiology   Hypokalemia: resolved Hypomagnesia continue p.o. Mag-Ox  Hypocalcemia-calcium improved 8.9.  Cont po calcium supplementation.   Hyponatremia: Resolved.    Transaminitis: Question fatty liver versus alcohol use related.  Level downtrending Alcohol use: Discussed about cessation.  Monitor for signs of withdrawal  Class II Obesity:Patient's Body mass index is 38.37 kg/m. : Will benefit with PCP follow-up, weight loss  healthy lifestyle and outpatient sleep evaluation.  DVT prophylaxis:  Code Status:   Code Status: Full Code Family Communication: plan of care discussed with patient and significant other at bedside. 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 Anticipated Disposition: home   Objective: Vitals last 24 hrs: Vitals:   01/11/21 1227 01/11/21 2020 01/11/21 2338 01/12/21 0307  BP: (!) 143/103 (!) 167/100 (!) 168/100 (!) 142/99  Pulse: 70 87 75 71  Resp: 18 16 18 20   Temp: 98.2 F (36.8 C) 98.2 F (36.8 C) 98.4 F (36.9 C) 97.9 F (36.6 C)  TempSrc: Oral Oral Oral Oral  SpO2: 99% 100% 100% 100%  Weight:      Height:       Weight change:  No intake or output data in the 24 hours ending 01/12/21 0800  Net IO Since Admission: 1,570.91 mL [01/12/21 0800]   Physical Examination: General exam: AAOx 3, obesen HEENT:Oral mucosa moist, Ear/Nose WNL grossly, dentition normal. Respiratory system: bilaterally diminished,no use of accessory muscle Cardiovascular system: S1 & S2 +, No JVD,. Gastrointestinal system: Abdomen soft,NT,ND, BS+ Nervous System:Alert, awake, moving extremities and grossly nonfocal Extremities: No edema, distal peripheral pulses palpable.  Skin: No rashes,no icterus. MSK: Normal muscle bulk,tone, power    Medications reviewed:  Scheduled Meds:  calcium carbonate  1 tablet  Oral TID   Chlorhexidine Gluconate Cloth  6 each Topical Q0600   levETIRAcetam  500 mg Oral BID   magnesium oxide  400 mg Oral BID   metoprolol tartrate  25 mg Oral BID   mupirocin  ointment  1 application Nasal BID   potassium chloride  20 mEq Oral Daily   Ensure Max Protein  11 oz Oral BID   sodium chloride flush  3 mL Intravenous Q12H   Continuous Infusions:     Diet Order             Diet NPO time specified  Diet effective midnight                   Weight change:   Wt Readings from Last 3 Encounters:  01/08/21 111.1 kg  10/06/20 117.9 kg     Consultants:see note  Procedures:see note Antimicrobials: Anti-infectives (From admission, onward)    None      Culture/Microbiology No results found for: SDES, SPECREQUEST, CULT, REPTSTATUS  Other culture-see note  Unresulted Labs (From admission, onward)     Start     Ordered   01/12/21 0500  Basic metabolic panel  Daily,   R     Question:  Specimen collection method  Answer:  Lab=Lab collect   01/11/21 0809   Signed and Held  Surgical PCR screen  (Screening)  Once,   R        Signed and Held           Data Reviewed: I have personally reviewed following labs and imaging studies CBC: Recent Labs  Lab 01/08/21 1355  WBC 8.3  NEUTROABS 6.6  HGB 12.5  HCT 39.8  MCV 84.1  PLT 350    Basic Metabolic Panel: Recent Labs  Lab 01/08/21 1355 01/08/21 2238 01/09/21 0840 01/10/21 0654 01/11/21 0145 01/12/21 0256  NA 133* 134* 135 135 136 138  K 3.4* 3.3* 3.2* 3.6 3.7 4.1  CL 99 101 105 110 108 106  CO2 21* 20* 23 20* 23 22  GLUCOSE 111* 98 104* 105* 103* 95  BUN 13 7 6  5* <5* 7  CREATININE 1.10* 1.09* 1.10* 1.09* 1.06* 1.02*  CALCIUM 8.2* 8.1* 7.9* 7.8* 8.7* 8.9  MG 2.2 1.9 2.3 1.9  --  1.7    GFR: Estimated Creatinine Clearance: 95.2 mL/min (A) (by C-G formula based on SCr of 1.02 mg/dL (H)). Liver Function Tests: Recent Labs  Lab 01/08/21 1355 01/09/21 0840 01/11/21 0145  AST 93* 104* 93*  ALT 67* 60* 60*  ALKPHOS 99 81 74  BILITOT 1.2 1.7* 1.2  PROT 7.8 6.3* 6.1*  ALBUMIN 4.1 3.1* 2.8*    No results for input(s): LIPASE, AMYLASE in the last 168 hours. No  results for input(s): AMMONIA in the last 168 hours. Coagulation Profile: No results for input(s): INR, PROTIME in the last 168 hours. Cardiac Enzymes: No results for input(s): CKTOTAL, CKMB, CKMBINDEX, TROPONINI in the last 168 hours. BNP (last 3 results) No results for input(s): PROBNP in the last 8760 hours. HbA1C: No results for input(s): HGBA1C in the last 72 hours. CBG: No results for input(s): GLUCAP in the last 168 hours. Lipid Profile: No results for input(s): CHOL, HDL, LDLCALC, TRIG, CHOLHDL, LDLDIRECT in the last 72 hours. Thyroid Function Tests: Recent Labs    01/11/21 0145  TSH 4.922*  FREET4 0.83    Anemia Panel: No results for input(s): VITAMINB12, FOLATE, FERRITIN, TIBC, IRON, RETICCTPCT in the last 72 hours. Sepsis  Labs: No results for input(s): PROCALCITON, LATICACIDVEN in the last 168 hours.  Recent Results (from the past 240 hour(s))  Resp Panel by RT-PCR (Flu A&B, Covid) Nasopharyngeal Swab     Status: None   Collection Time: 01/08/21  3:40 PM   Specimen: Nasopharyngeal Swab; Nasopharyngeal(NP) swabs in vial transport medium  Result Value Ref Range Status   SARS Coronavirus 2 by RT PCR NEGATIVE NEGATIVE Final    Comment: (NOTE) SARS-CoV-2 target nucleic acids are NOT DETECTED.  The SARS-CoV-2 RNA is generally detectable in upper respiratory specimens during the acute phase of infection. The lowest concentration of SARS-CoV-2 viral copies this assay can detect is 138 copies/mL. A negative result does not preclude SARS-Cov-2 infection and should not be used as the sole basis for treatment or other patient management decisions. A negative result may occur with  improper specimen collection/handling, submission of specimen other than nasopharyngeal swab, presence of viral mutation(s) within the areas targeted by this assay, and inadequate number of viral copies(<138 copies/mL). A negative result must be combined with clinical observations, patient  history, and epidemiological information. The expected result is Negative.  Fact Sheet for Patients:  BloggerCourse.com  Fact Sheet for Healthcare Providers:  SeriousBroker.it  This test is no t yet approved or cleared by the [place] FDA and  has been authorized for detection and/or diagnosis of SARS-CoV-2 by FDA under an Emergency Use Authorization (EUA). This EUA will remain  in effect (meaning this test can be used) for the duration of the COVID-19 declaration under Section 564(b)(1) of the Act, 21 U.S.C.section 360bbb-3(b)(1), unless the authorization is terminated  or revoked sooner.       Influenza A by PCR NEGATIVE NEGATIVE Final   Influenza B by PCR NEGATIVE NEGATIVE Final    Comment: (NOTE) The Xpert Xpress SARS-CoV-2/FLU/RSV plus assay is intended as an aid in the diagnosis of influenza from Nasopharyngeal swab specimens and should not be used as a sole basis for treatment. Nasal washings and aspirates are unacceptable for Xpert Xpress SARS-CoV-2/FLU/RSV testing.  Fact Sheet for Patients: BloggerCourse.com  Fact Sheet for Healthcare Providers: SeriousBroker.it  This test is not yet approved or cleared by the [place] FDA and has been authorized for detection and/or diagnosis of SARS-CoV-2 by FDA under an Emergency Use Authorization (EUA). This EUA will remain in effect (meaning this test can be used) for the duration of the COVID-19 declaration under Section 564(b)(1) of the Act, 21 U.S.C. section 360bbb-3(b)(1), unless the authorization is terminated or revoked.  Performed at [facility], [address] [place], [place] [address]   Surgical pcr screen     Status: Abnormal   Collection Time: 01/11/21  9:24 PM   Specimen: Nasal Mucosa; Nasal Swab  Result Value Ref Range Status   MRSA, PCR POSITIVE (A) NEGATIVE Final    Comment:  RESULT CALLED TO, READ BACK BY AND VERIFIED WITH: RN SONYA B. 01/11/21@23 :01 BY TW    Staphylococcus aureus POSITIVE (A) NEGATIVE Final    Comment: (NOTE) The Xpert SA Assay (FDA approved for NASAL specimens in patients 50 years of age and older), is one component of a comprehensive surveillance program. It is not intended to diagnose infection nor to guide or monitor treatment. Performed at [facility], [address], [place], [place] [address]       Radiology Studies: No results found.   LOS: 3 days   Lanae Boast, MD [facility]  01/12/2021, 8:00 AM

## 2021-01-12 NOTE — Progress Notes (Signed)
Right neck drsg remains intact, CDI, no drainage noted, no hematoma noted

## 2021-01-12 NOTE — Progress Notes (Signed)
Received a call from lab that MRSA swab result was positive for MRSA and staph.  I did order Bactroban per protocol and notified pt and MD.  Did educate pt on prevention and safety and informed her of what to expect and why she has to have Bactroban swabbed in her nostrils.  Also notified signif other earlier in the shift that for her to remain in the room, she would have to keep on mask at all times, as she does keep removing the mask.  She did put mask back on.

## 2021-01-12 NOTE — H&P (View-Only) (Signed)
Progress Note  Patient Name: Sharon Hanson Date of Encounter: 01/12/2021  [facility] Cardiologist: None   Subjective   Currently feeling well without complaint.  Inpatient Medications    Scheduled Meds:  calcium carbonate  1 tablet Oral TID   Chlorhexidine Gluconate Cloth  6 each Topical Q0600   levETIRAcetam  500 mg Oral BID   magnesium oxide  400 mg Oral BID   metoprolol tartrate  25 mg Oral BID   mupirocin ointment  1 application Nasal BID   potassium chloride  20 mEq Oral Daily   Ensure Max Protein  11 oz Oral BID   sodium chloride flush  3 mL Intravenous Q12H   Continuous Infusions:   PRN Meds: acetaminophen **OR** acetaminophen, hydrALAZINE, LORazepam, LORazepam   Vital Signs    Vitals:   01/11/21 2020 01/11/21 2338 01/12/21 0307 01/12/21 0840  BP: (!) 167/100 (!) 168/100 (!) 142/99 (!) 159/97  Pulse: 87 75 71 68  Resp: 16 18 20 16   Temp: 98.2 F (36.8 C) 98.4 F (36.9 C) 97.9 F (36.6 C) 98 F (36.7 C)  TempSrc: Oral Oral Oral   SpO2: 100% 100% 100% 100%  Weight:      Height:       No intake or output data in the 24 hours ending 01/12/21 0920  Last 3 Weights 01/08/2021 01/08/2021 10/06/2020  Weight (lbs) 245 lb 245 lb 260 lb  Weight (kg) 111.131 kg 111.131 kg 117.935 kg      Telemetry    Sinus rhythm occ PVCs- Personally Reviewed  ECG    None new- Personally Reviewed  Physical Exam   GEN: No acute distress.   Neck: No JVD Cardiac: RRR, no murmurs, rubs, or gallops.  Respiratory: CTA b/l. GI: Soft, nontender, non-distended  MS: No edema; No deformity. Neuro:  Nonfocal  Psych: Normal affect   Labs    High Sensitivity Troponin:  No results for input(s): TROPONINIHS in the last 720 hours.   Chemistry Recent Labs  Lab 01/08/21 1355 01/08/21 2238 01/09/21 0840 01/10/21 0654 01/11/21 0145 01/12/21 0256  NA 133*   < > 135 135 136 138  K 3.4*   < > 3.2* 3.6 3.7 4.1  CL 99   < > 105 110 108 106  CO2 21*   < > 23 20* 23 22   GLUCOSE 111*   < > 104* 105* 103* 95  BUN 13   < > 6 5* <5* 7  CREATININE 1.10*   < > 1.10* 1.09* 1.06* 1.02*  CALCIUM 8.2*   < > 7.9* 7.8* 8.7* 8.9  MG 2.2   < > 2.3 1.9  --  1.7  PROT 7.8  --  6.3*  --  6.1*  --   ALBUMIN 4.1  --  3.1*  --  2.8*  --   AST 93*  --  104*  --  93*  --   ALT 67*  --  60*  --  60*  --   ALKPHOS 99  --  81  --  74  --   BILITOT 1.2  --  1.7*  --  1.2  --   GFRNONAA >60   < > >60 >60 >60 >60  ANIONGAP 13   < > 7 5 5 10    < > = values in this interval not displayed.    Lipids No results for input(s): CHOL, TRIG, HDL, LABVLDL, LDLCALC, CHOLHDL in the last 168 hours.  Hematology Recent Labs  Lab 01/08/21 1355  WBC 8.3  RBC 4.73  HGB 12.5  HCT 39.8  MCV 84.1  MCH 26.4  MCHC 31.4  RDW 21.2*  PLT 350   Thyroid  Recent Labs  Lab 01/11/21 0145  TSH 4.922*  FREET4 0.83    BNPNo results for input(s): BNP, PROBNP in the last 168 hours.  DDimer No results for input(s): DDIMER in the last 168 hours.   Radiology    No results found.  Cardiac Studies   TTE  1. Left ventricular ejection fraction, by estimation, is 55 to 60%. The  left ventricle has normal function. The left ventricle has no regional  wall motion abnormalities. There is mild concentric left ventricular  hypertrophy. Left ventricular diastolic  parameters are consistent with Grade I diastolic dysfunction (impaired  relaxation).   2. Right ventricular systolic function is normal. The right ventricular  size is normal. There is normal pulmonary artery systolic pressure.   3. The mitral valve is normal in structure. Trivial mitral valve  regurgitation. No evidence of mitral stenosis.   4. The aortic valve is tricuspid. Aortic valve regurgitation is not  visualized. No aortic stenosis is present.   5. The inferior vena cava is normal in size with greater than 50%  respiratory variability, suggesting right atrial pressure of 3 mmHg.   Patient Profile     40 y.o. 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  Also found to have a wide-complex tachycardia.  Assessment & Plan    Wide-complex tachycardia:  Tachycardia appears to be due to ventricular tachycardia, though with a normal echo and her age,   Dr. Elberta Fortis felt she would benefit from EP study.    At EP study, if she has an atrial arrhythmia or accessory pathway, we Jahrel Borthwick plan for ablation.  If nothing is induced, Traycen Goyer likely need an ICD.    MRI interrupted 2/2 pt anxiety Planned to bring back down shortly with ativan on board  Discussed planned/potential procedures today, she did say that Dr. Elberta Fortis spoke with her yesterday about the planned procedures, aware of possible ICD implant Answered her f/u questions. She remains agreeable to proceed  For questions or updates, please contact [facility] Please consult [email or web address] for contact info under        Signed, Sheilah Pigeon, PA-C  01/12/2021, 9:20 AM    I have seen and examined this patient with Francis Dowse.  Agree with above, note added to reflect my findings.  On exam, RRR, no murmurs, lungs clear, no edema, no JVD.  No further episodes of wide-complex tachycardia.  Patient is feeling well today.  Patient does tell me that she has had palpitations and rapid heart rates in the past, though this has not been followed up by cardiology.  MRI scheduled for earlier today, but the patient had issues with anxiety.  We Colbe Viviano retry her on 10:00 this morning.  After the MRI, we Marni Franzoni plan for EP study and if an atrial arrhythmia or accessory pathway we Kanyon Seibold plan for ablation.  If no accessory pathway, the patient Evelio Rueda need ICD implant.  Lariza Cothron M. Brady Plant MD 01/12/2021 9:55 AM

## 2021-01-12 NOTE — Interval H&P Note (Signed)
History and Physical Interval Note:  01/12/2021 9:57 AM  Sharon Hanson  has presented today for surgery, with the diagnosis of vt.  The various methods of treatment have been discussed with the patient and family. After consideration of risks, benefits and other options for treatment, the patient has consented to  Procedure(s): ELECTROPHYSIOLOGY STUDY (N/A) ICD IMPLANT (N/A) as a surgical intervention.  The patient's history has been reviewed, patient examined, no change in status, stable for surgery.  I have reviewed the patient's chart and labs.  Questions were answered to the patient's satisfaction.     Talyn Eddie [employer]

## 2021-01-13 ENCOUNTER — Inpatient Hospital Stay (HOSPITAL_COMMUNITY): Payer: Self-pay

## 2021-01-13 ENCOUNTER — Encounter (HOSPITAL_COMMUNITY): Payer: Self-pay | Admitting: Cardiology

## 2021-01-13 ENCOUNTER — Other Ambulatory Visit (HOSPITAL_COMMUNITY): Payer: Self-pay

## 2021-01-13 LAB — BASIC METABOLIC PANEL
Anion gap: 6 (ref 5–15)
BUN: 5 mg/dL — ABNORMAL LOW (ref 6–20)
CO2: 21 mmol/L — ABNORMAL LOW (ref 22–32)
Calcium: 8.4 mg/dL — ABNORMAL LOW (ref 8.9–10.3)
Chloride: 105 mmol/L (ref 98–111)
Creatinine, Ser: 0.95 mg/dL (ref 0.44–1.00)
GFR, Estimated: >60 mL/min (ref >60)
Glucose, Bld: 96 mg/dL (ref 70–99)
Potassium: 4.1 mmol/L (ref 3.5–5.1)
Sodium: 132 mmol/L — ABNORMAL LOW (ref 135–145)

## 2021-01-13 IMAGING — DX DG CHEST 2V
2 series · 2 of 2 positions shown · non-contrast
Comparison: Chest x-ray [DATE], [DATE].

CLINICAL DATA: Cardiac device in-situ.

EXAM:
CHEST - 2 VIEW

[x chest ap]
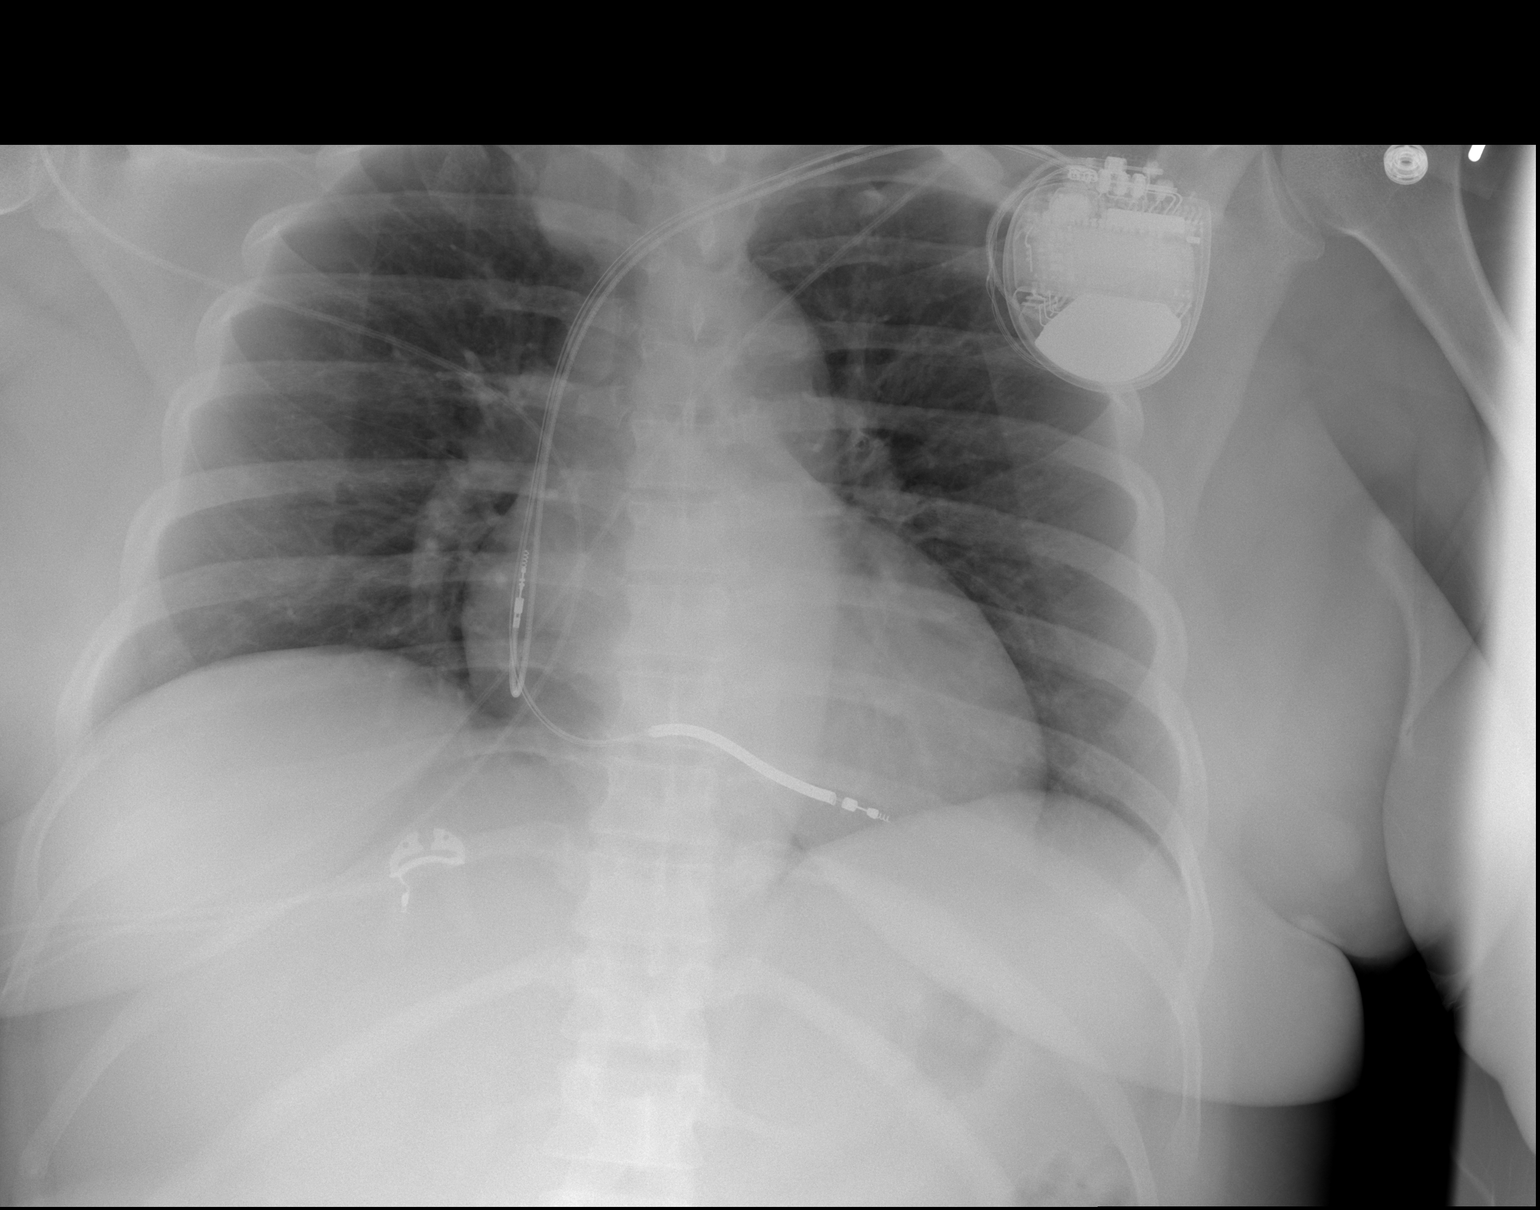

[w chest lat]
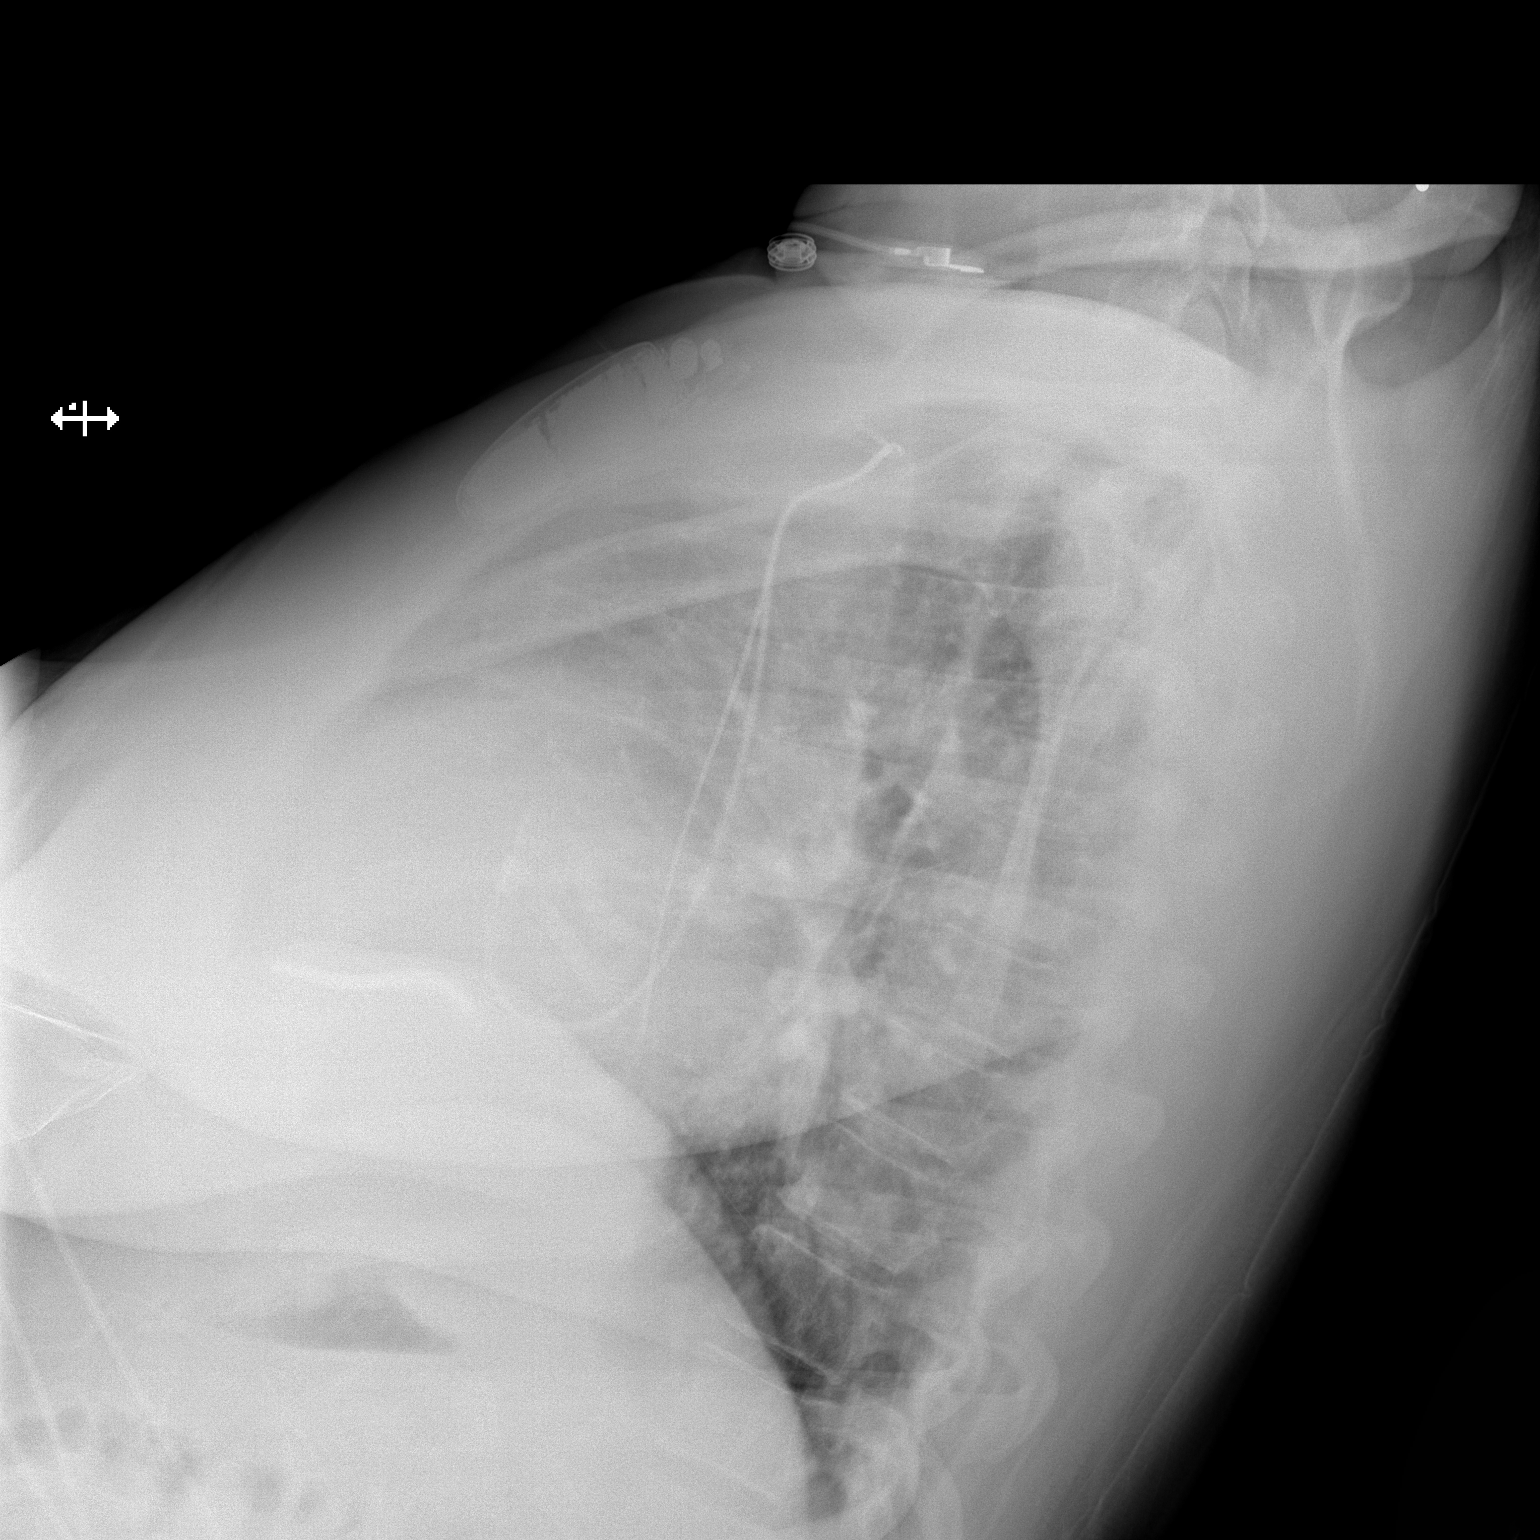

[2 of 2 positions shown; findings below may reference images not displayed]

FINDINGS: Cardiac pacer with lead tips over the right atrium and right
ventricle. Stable mild prominence of the great vessels. Heart size
normal. Low lung volumes. No focal infiltrate. No pleural effusion
or pneumothorax. No bony abnormality.
IMPRESSION: Cardiac pacer noted with lead tips over the right atrium and right
ventricle. No pneumothorax. No acute pulmonary disease.

## 2021-01-13 MED ORDER — CALCIUM CARBONATE ANTACID 500 MG PO CHEW
1.0000 | CHEWABLE_TABLET | Freq: Two times a day (BID) | ORAL | 0 refills | Status: AC
  Filled 2021-01-13: qty 120, 60d supply, fill #0

## 2021-01-13 MED ORDER — METOPROLOL TARTRATE 25 MG PO TABS
25.0000 mg | ORAL_TABLET | Freq: Two times a day (BID) | ORAL | 0 refills | Status: DC
  Filled 2021-01-13: qty 60, 30d supply, fill #0

## 2021-01-13 MED ORDER — POTASSIUM CHLORIDE CRYS ER 20 MEQ PO TBCR
20.0000 meq | EXTENDED_RELEASE_TABLET | Freq: Every day | ORAL | 0 refills | Status: DC
  Filled 2021-01-13: qty 7, 7d supply, fill #0

## 2021-01-13 MED ORDER — CHLORHEXIDINE GLUCONATE CLOTH 2 % EX PADS: 6.0000 | MEDICATED_PAD | Freq: Every day | CUTANEOUS | Status: DC

## 2021-01-13 MED ORDER — LEVETIRACETAM 500 MG PO TABS
500.0000 mg | ORAL_TABLET | Freq: Two times a day (BID) | ORAL | 0 refills | Status: DC
  Filled 2021-01-13: qty 120, 60d supply, fill #0

## 2021-01-13 MED ORDER — MAGNESIUM OXIDE 400 MG PO TABS
400.0000 mg | ORAL_TABLET | Freq: Two times a day (BID) | ORAL | 0 refills | Status: AC
Start: 2021-01-13 — End: 2021-01-16
  Filled 2021-01-13: qty 6, 3d supply, fill #0

## 2021-01-13 MED FILL — Bupivacaine HCl Preservative Free (PF) Inj 0.25%: INTRAMUSCULAR | Qty: 30 | Status: AC

## 2021-01-13 MED FILL — Heparin Sod (Porcine)-NaCl IV Soln 1000 Unit/500ML-0.9%: INTRAVENOUS | Qty: 500 | Status: AC

## 2021-01-13 NOTE — TOC Transition Note (Signed)
Transition of Care [facility]) - CM/SW Discharge Note   Patient Details  Name: Sharon Hanson MRN: [id number] Date of Birth: 06/17/1980  Transition of Care [facility]) CM/SW Contact:  Leone Haven, RN Phone Number: 01/13/2021, 9:58 AM   Clinical Narrative:    NCM spoke with [facility] pharmacy and MD, MD will need to send scripts to pharmacy to fill for two months worth.  MD states he is calling [facility] pharmacy now.  Patient is for dc today.      Barriers to Discharge: Continued Medical Work up, Inadequate or no insurance   Patient Goals and CMS Choice        Discharge Placement                       Discharge Plan and Services   Discharge Planning Services: CM Consult                                 Social Determinants of Health (SDOH) Interventions     Readmission Risk Interventions No flowsheet data found.

## 2021-01-13 NOTE — Discharge Summary (Signed)
Physician Discharge Summary  Sharon Hanson ZOX:[id number] DOB: 07/04/1980 DOA: 01/08/2021  PCP: Pcp, No  Admit date: 01/08/2021 Discharge date: 01/13/2021  Admitted From: home Disposition:  home  Recommendations for Outpatient Follow-up:  Follow up with PCP in 1-2 weeks, with EP cardiology- call office  Home Health:no  Equipment/Devices: none  Discharge Condition: Stable Code Status:   Code Status: Full Code Diet recommendation:  Diet Order             Diet Heart Room service appropriate? Yes; Fluid consistency: Thin  Diet effective now                   Brief/Interim Summary: 40 y.o. female with PMH of hypertension obesity episode described as strong diet likely seizure did not have any work-up for doctor visit brought to the ED after having seizure episode at work.In the ED while getting CT scan set on the episode of G TCS was monosaturated did not remember the for MRI.  She is admitted for further management.  She was given Keppra.  Following postictal state and tongue biting she was found to be tachycardic with heart rate into 240s-cardiology was also consulted concerned about  V. tach placed on amiodarone. Patient was transferred to [facility] and seen by neurology and cardiology. Seen by EP, neurology. Underwent cardiac MRI without utility for V. tach and underwent EP study that was negative, S/P ICD implanted, patient is also on AEDs and will be on driving restriction x6 months.  She will follow-up with EP cardiology, neurology and PCP as outpatient.  At this time she is medically stable.  She has been cleared for discharge by cardiology with instruction for home  Discharge Diagnoses:  WCT/VT:Echo, CARDIAC MRI -Normal. underwent EP study that was negative, S/P ICD implanted.   Seizures, generalized convulsive: With previous history of for seizure x1, and presented with seizure episodes x2- MRI - partially empty sella.  Underwent EEG and neurology evaluation,  recommending to continue with Keppra 500 twice daily, nondriving x6 months and she will follow-up with neurology outpatient.    Hypertension: Stable continue metoprolol    Hypokalemia: resolved. Hypomagnesia resolved.  Hypocalcemia-calcium improved 8.9. Hyponatremia: Resolved.   Transaminitis: Question fatty liver versus alcohol use related.  Level downtrending. Lft in 1 wk from pcp. Alcohol use: Discussed about cessation.  Monitor for signs of withdrawal Class II Obesity:Patient's Body mass index is 38.37 kg/m. : Will benefit with PCP follow-up, weight loss healthy lifestyle and outpatient sleep evaluation.  Consults: EP NEURO  Subjective: Alert awake oriented x3 this morning resting comfortably.  Eager to go home.  Discharge Exam: Vitals:   01/13/21 0616 01/13/21 0858  BP: (!) 176/95 (!) 146/90  Pulse: 77 83  Resp: 16 18  Temp: 98.9 F (37.2 C) 98 F (36.7 C)  SpO2: 98% 99%   General: Pt is alert, awake, not in acute distress Cardiovascular: RRR, S1/S2 +, no rubs, no gallops Respiratory: CTA bilaterally, no wheezing, no rhonchi Abdominal: Soft, NT, ND, bowel sounds + Extremities: no edema, no cyanosis  Discharge Instructions  Discharge Instructions     Discharge instructions   Complete by: As directed    Follow-up with neurology, EP cardiology, continue wound care instruction as provided  No driving x6 months until cleared by neurology as per [facility] law  Per [facility] statutes, patients with seizures are not allowed to drive until  they have been seizure-free for six months. Use caution when using heavy equipment or power  tools. Avoid working on ladders or at heights. Take showers instead of baths. Ensure the water temperature is not too high on the home water heater. Do not go swimming alone. When caring for infants or small children, sit down when holding, feeding, or changing them to minimize risk of injury to the child in the event you have a  seizure.  Maintain good sleep hygiene. Avoid alcohol.   Increase activity slowly   Complete by: As directed       Allergies as of 01/13/2021   No Known Allergies      Medication List     TAKE these medications    calcium carbonate 500 MG chewable tablet Commonly known as: TUMS - dosed in mg elemental calcium Chew 1 tablet (200 mg of elemental calcium total) by mouth 2 (two) times daily.   levETIRAcetam 500 MG tablet Commonly known as: KEPPRA Take 1 tablet (500 mg total) by mouth 2 (two) times daily.   magnesium oxide 400 (240 Mg) MG tablet Commonly known as: MAG-OX Take 1 tablet (400 mg total) by mouth 2 (two) times daily for 3 days.   metoprolol tartrate 25 MG tablet Commonly known as: LOPRESSOR Take 1 tablet (25 mg total) by mouth 2 (two) times daily.   potassium chloride SA 20 MEQ tablet Commonly known as: KLOR-CON Take 1 tablet (20 mEq total) by mouth daily for 7 days. Start taking on: January 14, 2021        Follow-up Information     [facility] Follow up on 03/05/2021.   Why: Your appointment is at 1:50 pm. Please arrive early and bring a picture ID, current medications. Please use their pharmacy for your medication needs. They assist with the cost. Contact information: [address] [place] [address] [phone]        Regan Lemming, MD Follow up in 1 week(s).   Specialty: Cardiology Contact information: [address] [place] [address] [phone]                No Known Allergies  The results of significant diagnostics from this hospitalization (including imaging, microbiology, ancillary and laboratory) are listed below for reference.    Microbiology: Recent Results (from the past 240 hour(s))  Resp Panel by RT-PCR (Flu A&B, Covid) Nasopharyngeal Swab     Status: None   Collection Time: 01/08/21  3:40 PM   Specimen: Nasopharyngeal Swab; Nasopharyngeal(NP)  swabs in vial transport medium  Result Value Ref Range Status   SARS Coronavirus 2 by RT PCR NEGATIVE NEGATIVE Final    Comment: (NOTE) SARS-CoV-2 target nucleic acids are NOT DETECTED.  The SARS-CoV-2 RNA is generally detectable in upper respiratory specimens during the acute phase of infection. The lowest concentration of SARS-CoV-2 viral copies this assay can detect is 138 copies/mL. A negative result does not preclude SARS-Cov-2 infection and should not be used as the sole basis for treatment or other patient management decisions. A negative result may occur with  improper specimen collection/handling, submission of specimen other than nasopharyngeal swab, presence of viral mutation(s) within the areas targeted by this assay, and inadequate number of viral copies(<138 copies/mL). A negative result must be combined with clinical observations, patient history, and epidemiological information. The expected result is Negative.  Fact Sheet for Patients:  BloggerCourse.com  Fact Sheet for Healthcare Providers:  SeriousBroker.it  This test is no t yet approved or cleared by the [place] FDA and  has  been authorized for detection and/or diagnosis of SARS-CoV-2 by FDA under an Emergency Use Authorization (EUA). This EUA will remain  in effect (meaning this test can be used) for the duration of the COVID-19 declaration under Section 564(b)(1) of the Act, 21 U.S.C.section 360bbb-3(b)(1), unless the authorization is terminated  or revoked sooner.       Influenza A by PCR NEGATIVE NEGATIVE Final   Influenza B by PCR NEGATIVE NEGATIVE Final    Comment: (NOTE) The Xpert Xpress SARS-CoV-2/FLU/RSV plus assay is intended as an aid in the diagnosis of influenza from Nasopharyngeal swab specimens and should not be used as a sole basis for treatment. Nasal washings and aspirates are unacceptable for Xpert Xpress  SARS-CoV-2/FLU/RSV testing.  Fact Sheet for Patients: BloggerCourse.com  Fact Sheet for Healthcare Providers: SeriousBroker.it  This test is not yet approved or cleared by the [place] FDA and has been authorized for detection and/or diagnosis of SARS-CoV-2 by FDA under an Emergency Use Authorization (EUA). This EUA will remain in effect (meaning this test can be used) for the duration of the COVID-19 declaration under Section 564(b)(1) of the Act, 21 U.S.C. section 360bbb-3(b)(1), unless the authorization is terminated or revoked.  Performed at [facility], [address], [place], [place] [address]   Surgical pcr screen     Status: Abnormal   Collection Time: 01/11/21  9:24 PM   Specimen: Nasal Mucosa; Nasal Swab  Result Value Ref Range Status   MRSA, PCR POSITIVE (A) NEGATIVE Final    Comment: RESULT CALLED TO, READ BACK BY AND VERIFIED WITH: RN SONYA B. 01/11/21@23 :01 BY TW    Staphylococcus aureus POSITIVE (A) NEGATIVE Final    Comment: (NOTE) The Xpert SA Assay (FDA approved for NASAL specimens in patients 71 years of age and older), is one component of a comprehensive surveillance program. It is not intended to diagnose infection nor to guide or monitor treatment. Performed at [facility], [address], [place], [place] [address]     Procedures/Studies: DG Chest 2 View  Result Date: 01/13/2021 CLINICAL DATA:  Cardiac device in-situ. EXAM: CHEST - 2 VIEW COMPARISON:  Chest x-ray 01/08/2021, 10/06/2020. FINDINGS: Cardiac pacer with lead tips over the right atrium and right ventricle. Stable mild prominence of the great vessels. Heart size normal. Low lung volumes. No focal infiltrate. No pleural effusion or pneumothorax. No bony abnormality. IMPRESSION: Cardiac pacer noted with lead tips over the right atrium and right ventricle. No pneumothorax. No acute pulmonary disease. Electronically  Signed   By: Maisie Fus  Register M.D.   On: 01/13/2021 07:20   CT Head Wo Contrast  Result Date: 01/08/2021 CLINICAL DATA:  Seizure, nontraumatic (Age 34-40y) EXAM: CT HEAD WITHOUT CONTRAST TECHNIQUE: Contiguous axial images were obtained from the base of the skull through the vertex without intravenous contrast. COMPARISON:  None. FINDINGS: Brain: No acute intracranial abnormality. Specifically, no hemorrhage, hydrocephalus, mass lesion, acute infarction, or significant intracranial injury. Vascular: No hyperdense vessel or unexpected calcification. Skull: No acute calvarial abnormality. Sinuses/Orbits: No acute findings Other: None IMPRESSION: Normal study. Electronically Signed   By: Charlett Nose M.D.   On: 01/08/2021 17:20   MR BRAIN WO CONTRAST  Result Date: 01/08/2021 CLINICAL DATA:  Seizure, nontraumatic (Age 23-40y) EXAM: MRI HEAD WITHOUT CONTRAST TECHNIQUE: Multiplanar, multiecho pulse sequences of the brain and surrounding structures were obtained without intravenous contrast. COMPARISON:  None. FINDINGS: Mildly motion study. Brain: No acute infarction, hemorrhage, hydrocephalus, extra-axial collection or mass lesion. The hippocampi are  within normal limits and symmetric in size/signal. Partially empty and expanded sella. Vascular: Major arterial flow voids are maintained at the skull base. Skull and upper cervical spine: Normal marrow signal. Sinuses/Orbits: Clear sinuses.  Unremarkable orbits. Other: No mastoid effusions. IMPRESSION: 1. No evidence of acute abnormality. 2. Partially empty and expanded sella, which is often a normal anatomic variant but can be associated with idiopathic intracranial hypertension. Electronically Signed   By: Feliberto Harts M.D.   On: 01/08/2021 18:36   EP STUDY  Result Date: 01/12/2021 SURGEON: Loman Brooklyn, MD PREPROCEDURE DIAGNOSIS: Wide-complex tachycardia POSTPROCEDURE DIAGNOSIS: Wide-complex tachycardia PROCEDURES: 1. Comprehensive EP study. 2. Coronary  sinus pacing and recording. 3.  isuprel infused INTRODUCTION: Sharon Hanson is a 40 y.o. female with a history of symptomatic wide-complex tachycardia who presents today for EP study and radiofrequency ablation.  The patient had syncope associated with wide-complex tachycardia.   DESCRIPTION OF PROCEDURE: Informed written consent was obtained and the patient was brought to the Electrophysiology Lab in the fasting state. The patient was adequately sedated with intravenous medication as outlined in the anesthesia report. The patient's right neck and groin was prepped and draped in the usual sterile fashion by the EP Lab staff. Using a percutaneous Seldinger technique, one 8 [place] and one 6 [place] hemostasis sheath was placed in the right femoral vein, one 7 [place] and one 6 [place] hemostasis sheath was placed in the left femoral vein.  Two 6-French quadripolar Josephson catheters were introduced through the right and left common femoral vein and advanced into the His bundle and right ventricular apex positions for recording and pacing.  The coronary sinus was difficult to access.  Initially, A Biosense Webster auto ID CS catheter was placed in the coronary sinus through the left femoral vein.  This catheter was not able to be advanced into the coronary sinus.  Due to difficulty, a Biosense Webster AcuNav sound star catheter was placed into the right atrium.  Ultrasound map was made of the right atrium including the coronary sinus.  Multiple attempts with the above catheter were made, but the coronary sinus was not able to be accessed.  Multiple catheters from the leg were used, but again none of them could be advanced out the coronary sinus.  A 7 French sheath was placed in the right jugular vein.  A hexapolar catheter was placed through this sheath and advanced into the right atrium.  Again, this catheter could not be advanced into the coronary sinus.  The catheter was advanced into the os of the coronary sinus,  but it could not be advanced further.  Direct ultrasound guidance is used for right and left femoral veins with normal vessel patency. Ultrasound images are captured and stored in the patient's chart. Using ultrasound guidance, the Brockenbrough needle and wire were visualized entering the vessel. Presenting Measurements: The patient presented to the Electrophysiology Lab in sinus rhythm. The PR interval was 108 msec with a QRS of 94 msec and a Qt of 388 msec. The average RR interval was 752 msec. The AH interval was 64 msec and the HV interval was 42 milliseconds. EP study: Ventricular pacing was performed which reveals midline concentric decremental VA conduction with a single retrograde jump but no echo beats of tachycardias. The VA WCL was 530 msec.  Triple extrastimuli did not induce ventricular tachycardia.  Triple stimuli at 500/280/270/260 resulted in loss of capture on the S 3.  Atrial pacing was performed.  AV Wenckebach was 340 ms.  No  tachycardia was induced.  AV node ERP was 450/270 ms.  The patient had multiple jumps, but no echo beats.  Isoproterenol at 4 mcg/kg/min was infused.  The patient had accelerated junctional rhythm with isoproterenol.  Further atrial pacing was performed with no tachycardia induced.  CONCLUSIONS: 1. Sinus rhythm upon presentation. 2.  Negative EP study SURGEON:  Will Camnitz, MD    PREPROCEDURE DIAGNOSES:  1.  Wide-complex tachycardia    POSTPROCEDURE DIAGNOSES:  1.  Wide-complex tachycardia    PROCEDURES:   1. ICD implantation.  2. Upper extremity venogram   INTRODUCTION:  Sharon Hanson is a 40 y.o. female who presented to the hospital with episodes of syncope and wide-complex tachycardia.  She also has generalized tonic-clonic seizures.  EP study was performed which showed no evidence of atrial or ventricular arrhythmias.  Due to that, ICD was implanted.    DESCRIPTION OF PROCEDURE:  Informed written consent was obtained and the patient was brought to the  electrophysiology lab in the fasting state. The patient was adequately sedated with intravenous Versed, and fentanyl as outlined in the nursing report.  The patient's left chest was prepped and draped in the usual sterile fashion by the EP lab staff.  The skin overlying the left deltopectoral region was infiltrated with lidocaine for local analgesia.  A 5-cm incision was made over the left deltopectoral region.  A left subcutaneous defibrillator pocket was fashioned using a combination of sharp and blunt dissection.  Electrocautery was used to assure hemostasis. Left Upper extremity Venography:  A venogram of the left upper extremity was performed which revealed a moderate sized left axillary vein which emptied into a moderate sized left subclavian vein.  RA/RV Lead Placement: The left axillary vein was cannulated with fluoroscopic visualization.  Through the left axillary vein, a Medtronic model [id number]  (serial #[id number]) right atrial lead and a Medtronic, model [id number] (serial number [id number]) right ventricular defibrillator lead were advanced with fluoroscopic visualization into the right atrial appendage and right ventricular apex positions respectively.  Initial atrial lead P-waves measured 3 mV with an impedance of 930 ohms and a threshold of 0.4 volts at 0.5 milliseconds.  The right ventricular lead R-wave measured 8.3 mV with impedance of 763 ohms and a threshold of 0.7 volts at 0.5 milliseconds. The leads were secured to the pectoralis  fascia using #2 silk suture over the suture sleeves.  The pocket then  irrigated with copious gentamicin solution.  The leads were then  connected to a Medtronic Evara MRI XXT DR SureScan (serial  Number [id number]) ICD.  The defibrillator was placed into the  pocket.  The pocket was then closed in 3 layers with 2.0 Vicryl suture for the subcutaneous and 3.0 Vicryl suture subcuticular layers. EBL<47ml.  Steri-Strips and a  sterile dressing were then applied.   CONCLUSIONS:  1. Ischemic cardiomyopathy with chronic New York Heart Association class III heart failure.  2. Successful ICD implantation.  3. No early apparent complications. Will Jorja Loa, MD 01/12/2021 7:09 PM   EP PPM/ICD IMPLANT  Result Date: 01/12/2021 SURGEON: Loman Brooklyn, MD PREPROCEDURE DIAGNOSIS: Wide-complex tachycardia POSTPROCEDURE DIAGNOSIS: Wide-complex tachycardia PROCEDURES: 1. Comprehensive EP study. 2. Coronary sinus pacing and recording. 3.  isuprel infused INTRODUCTION: Sharon Hanson is a 40 y.o. female with a history of symptomatic wide-complex tachycardia who presents today for EP study and radiofrequency ablation.  The patient had syncope associated with wide-complex tachycardia.   DESCRIPTION OF PROCEDURE: Informed written consent was obtained and the  patient was brought to the Electrophysiology Lab in the fasting state. The patient was adequately sedated with intravenous medication as outlined in the anesthesia report. The patient's right neck and groin was prepped and draped in the usual sterile fashion by the EP Lab staff. Using a percutaneous Seldinger technique, one 8 [place] and one 6 [place] hemostasis sheath was placed in the right femoral vein, one 7 [place] and one 6 [place] hemostasis sheath was placed in the left femoral vein.  Two 6-French quadripolar Josephson catheters were introduced through the right and left common femoral vein and advanced into the His bundle and right ventricular apex positions for recording and pacing.  The coronary sinus was difficult to access.  Initially, A Biosense Webster auto ID CS catheter was placed in the coronary sinus through the left femoral vein.  This catheter was not able to be advanced into the coronary sinus.  Due to difficulty, a Biosense Webster AcuNav sound star catheter was placed into the right atrium.  Ultrasound map was made of the right atrium including the coronary sinus.  Multiple attempts with the above catheter  were made, but the coronary sinus was not able to be accessed.  Multiple catheters from the leg were used, but again none of them could be advanced out the coronary sinus.  A 7 French sheath was placed in the right jugular vein.  A hexapolar catheter was placed through this sheath and advanced into the right atrium.  Again, this catheter could not be advanced into the coronary sinus.  The catheter was advanced into the os of the coronary sinus, but it could not be advanced further.  Direct ultrasound guidance is used for right and left femoral veins with normal vessel patency. Ultrasound images are captured and stored in the patient's chart. Using ultrasound guidance, the Brockenbrough needle and wire were visualized entering the vessel. Presenting Measurements: The patient presented to the Electrophysiology Lab in sinus rhythm. The PR interval was 108 msec with a QRS of 94 msec and a Qt of 388 msec. The average RR interval was 752 msec. The AH interval was 64 msec and the HV interval was 42 milliseconds. EP study: Ventricular pacing was performed which reveals midline concentric decremental VA conduction with a single retrograde jump but no echo beats of tachycardias. The VA WCL was 530 msec.  Triple extrastimuli did not induce ventricular tachycardia.  Triple stimuli at 500/280/270/260 resulted in loss of capture on the S 3.  Atrial pacing was performed.  AV Wenckebach was 340 ms.  No tachycardia was induced.  AV node ERP was 450/270 ms.  The patient had multiple jumps, but no echo beats.  Isoproterenol at 4 mcg/kg/min was infused.  The patient had accelerated junctional rhythm with isoproterenol.  Further atrial pacing was performed with no tachycardia induced.  CONCLUSIONS: 1. Sinus rhythm upon presentation. 2.  Negative EP study SURGEON:  Will Camnitz, MD    PREPROCEDURE DIAGNOSES:  1.  Wide-complex tachycardia    POSTPROCEDURE DIAGNOSES:  1.  Wide-complex tachycardia    PROCEDURES:   1. ICD implantation.  2.  Upper extremity venogram   INTRODUCTION:  Sharon Hanson is a 40 y.o. female who presented to the hospital with episodes of syncope and wide-complex tachycardia.  She also has generalized tonic-clonic seizures.  EP study was performed which showed no evidence of atrial or ventricular arrhythmias.  Due to that, ICD was implanted.    DESCRIPTION OF PROCEDURE:  Informed written consent was obtained and the patient  was brought to the electrophysiology lab in the fasting state. The patient was adequately sedated with intravenous Versed, and fentanyl as outlined in the nursing report.  The patient's left chest was prepped and draped in the usual sterile fashion by the EP lab staff.  The skin overlying the left deltopectoral region was infiltrated with lidocaine for local analgesia.  A 5-cm incision was made over the left deltopectoral region.  A left subcutaneous defibrillator pocket was fashioned using a combination of sharp and blunt dissection.  Electrocautery was used to assure hemostasis. Left Upper extremity Venography:  A venogram of the left upper extremity was performed which revealed a moderate sized left axillary vein which emptied into a moderate sized left subclavian vein.  RA/RV Lead Placement: The left axillary vein was cannulated with fluoroscopic visualization.  Through the left axillary vein, a Medtronic model [phone]  (serial #[id number]) right atrial lead and a Medtronic, model [id number] (serial number [id number]) right ventricular defibrillator lead were advanced with fluoroscopic visualization into the right atrial appendage and right ventricular apex positions respectively.  Initial atrial lead P-waves measured 3 mV with an impedance of 930 ohms and a threshold of 0.4 volts at 0.5 milliseconds.  The right ventricular lead R-wave measured 8.3 mV with impedance of 763 ohms and a threshold of 0.7 volts at 0.5 milliseconds. The leads were secured to the pectoralis  fascia using #2 silk suture over the  suture sleeves.  The pocket then  irrigated with copious gentamicin solution.  The leads were then  connected to a Medtronic Evara MRI XXT DR SureScan (serial  Number [id number]) ICD.  The defibrillator was placed into the  pocket.  The pocket was then closed in 3 layers with 2.0 Vicryl suture for the subcutaneous and 3.0 Vicryl suture subcuticular layers. EBL<89ml.  Steri-Strips and a  sterile dressing were then applied.  CONCLUSIONS:  1. Ischemic cardiomyopathy with chronic New York Heart Association class III heart failure.  2. Successful ICD implantation.  3. No early apparent complications. Will Jorja Loa, MD 01/12/2021 7:09 PM   DG Chest Portable 1 View  Result Date: 01/08/2021 CLINICAL DATA:  Syncope and seizure. EXAM: PORTABLE CHEST 1 VIEW COMPARISON:  Radiograph 10/06/2020 FINDINGS: The cardiomediastinal contours are normal. Stable prominence of the right and left paratracheal stripe. Unchanged mild elevation of right hemidiaphragm. Pulmonary vasculature is normal. No consolidation, pleural effusion, or pneumothorax. No acute osseous abnormalities are seen. IMPRESSION: No acute chest findings. Electronically Signed   By: Narda Rutherford M.D.   On: 01/08/2021 17:42   DG Knee Complete 4 Views Right  Result Date: 01/08/2021 CLINICAL DATA:  Fall, right knee pain. EXAM: RIGHT KNEE - COMPLETE 4+ VIEW COMPARISON:  None. FINDINGS: No evidence of fracture, dislocation, or joint effusion. Mild medial tibiofemoral joint space narrowing. Trace patellofemoral spurring. There is a small quadriceps tendon enthesophyte. Soft tissues are unremarkable. IMPRESSION: 1. No fracture or dislocation. 2. Mild degenerative change. 3. Small quadriceps tendon enthesophyte. Electronically Signed   By: Narda Rutherford M.D.   On: 01/08/2021 22:36   MR CARDIAC MORPHOLOGY W WO CONTRAST  Result Date: 01/12/2021 CLINICAL DATA:  Wide Complex tachycardia EXAM: CARDIAC MRI TECHNIQUE: The patient was scanned on a 1.5 Tesla  Siemens magnet. A dedicated cardiac coil was used. Functional imaging was done using Fiesta sequences. 2,3, and 4 chamber views were done to assess for RWMA's. Modified Simpson's rule using a short axis stack was used to calculate an ejection fraction on a dedicated work  station using [employer]. The patient received 10 cc of Gadavist. After 10 minutes inversion recovery sequences were used to assess for infiltration and scar tissue. CONTRAST:  ZOXWRUEA FINDINGS: Normal cardiac chamber sizes. Normal ascending aortic root 3.0 cm Trivial LV posterior pericardial effusion No ASD/PFO. Normal cardiac valves Normal LV size and function No RWMA's Septal thickness 11 mm Quantitative EF 53% (EDV 193 cc ESV 91 cc SV 102 cc) Normal RV size and function no evidence of diverticula or RV dysplasia. No gadolinium uptake in the delayed inversion recovery sequences. Normal nulling of the LV myocardium Minimally elevated ECV 32% Normal T1 925 msec and normal T2 43 msec IMPRESSION: 1.  Normal LV size and function EF 53% 2.  Normal RV size and function no dysplasia 3.  Normal parametric measures 4.  No delayed gadolinium uptake 5.  Trivial LV posterior pericardial effusion 6.  Normal cardiac valves 7.  Normal ascending aortic root 3.0 cm Charlton Haws Electronically Signed   By: Charlton Haws M.D.   On: 01/12/2021 15:35   EEG adult  Result Date: 01/09/2021 Charlsie Quest, MD     01/09/2021 12:16 PM Patient Name: Sharon Hanson MRN: [id number] Epilepsy Attending: Charlsie Quest Referring Physician/Provider: Dr Bobette Mo Date: 01/09/2021 Duration: 24.24 mins Patient history: 40yo F with seizure like episode. EEG to evaluate for seizure. Level of alertness: Awake AEDs during EEG study: None Technical aspects: This EEG study was done with scalp electrodes positioned according to the 10-20 International system of electrode placement. Electrical activity was acquired at a sampling rate of  and reviewed with a  high frequency filter of  and a low frequency filter of . EEG data were recorded continuously and digitally stored. Description: The posterior dominant rhythm consists of 9 Hz activity of moderate voltage (25-35 uV) seen predominantly in posterior head regions, symmetric and reactive to eye opening and eye closing. Hyperventilation and photic stimulation were not performed.   IMPRESSION: This study is within normal limits. No seizures or epileptiform discharges were seen throughout the recording. Charlsie Quest   ECHOCARDIOGRAM COMPLETE  Result Date: 01/09/2021    ECHOCARDIOGRAM REPORT   Patient Name:   Sharon Hanson Date of Exam: 01/09/2021 Medical Rec #:  [id number]        Height:       67.0 in Accession #:    [id number]       Weight:       245.0 lb Date of Birth:  05-07-1980        BSA:          2.204 m Patient Age:    40 years         BP:           154/99 mmHg Patient Gender: F                HR:           76 bpm. Exam Location:  Inpatient Procedure: 2D Echo, Color Doppler and Cardiac Doppler Indications:    R94.31 Abnormal EKG  History:        Patient has no prior history of Echocardiogram examinations.                 Risk Factors:Hypertension.  Sonographer:    Irving Burton Senior RDCS Referring Phys: [id number] DAVID MANUEL ORTIZ IMPRESSIONS  1. Left ventricular ejection fraction, by estimation, is 55 to 60%. The left ventricle has normal function. The left ventricle has no regional wall  motion abnormalities. There is mild concentric left ventricular hypertrophy. Left ventricular diastolic parameters are consistent with Grade I diastolic dysfunction (impaired relaxation).  2. Right ventricular systolic function is normal. The right ventricular size is normal. There is normal pulmonary artery systolic pressure.  3. The mitral valve is normal in structure. Trivial mitral valve regurgitation. No evidence of mitral stenosis.  4. The aortic valve is tricuspid. Aortic valve regurgitation is not visualized. No  aortic stenosis is present.  5. The inferior vena cava is normal in size with greater than 50% respiratory variability, suggesting right atrial pressure of 3 mmHg. FINDINGS  Left Ventricle: Left ventricular ejection fraction, by estimation, is 55 to 60%. The left ventricle has normal function. The left ventricle has no regional wall motion abnormalities. The left ventricular internal cavity size was normal in size. There is  mild concentric left ventricular hypertrophy. Left ventricular diastolic parameters are consistent with Grade I diastolic dysfunction (impaired relaxation). Normal left ventricular filling pressure. Right Ventricle: The right ventricular size is normal. No increase in right ventricular wall thickness. Right ventricular systolic function is normal. There is normal pulmonary artery systolic pressure. The tricuspid regurgitant velocity is 1.31 m/s, and  with an assumed right atrial pressure of 3 mmHg, the estimated right ventricular systolic pressure is 9.9 mmHg. Left Atrium: Left atrial size was normal in size. Right Atrium: Right atrial size was normal in size. Pericardium: There is no evidence of pericardial effusion. Mitral Valve: The mitral valve is normal in structure. Trivial mitral valve regurgitation. No evidence of mitral valve stenosis. Tricuspid Valve: The tricuspid valve is normal in structure. Tricuspid valve regurgitation is trivial. No evidence of tricuspid stenosis. Aortic Valve: The aortic valve is tricuspid. Aortic valve regurgitation is not visualized. No aortic stenosis is present. Pulmonic Valve: The pulmonic valve was normal in structure. Pulmonic valve regurgitation is trivial. No evidence of pulmonic stenosis. Aorta: The aortic root is normal in size and structure. Venous: The inferior vena cava is normal in size with greater than 50% respiratory variability, suggesting right atrial pressure of 3 mmHg. IAS/Shunts: No atrial level shunt detected by color flow Doppler.  LEFT  VENTRICLE PLAX 2D LVIDd:         4.70 cm   Diastology LVIDs:         3.20 cm   LV e' medial:    5.87 cm/s LV PW:         1.20 cm   LV E/e' medial:  7.6 LV IVS:        1.10 cm   LV e' lateral:   6.85 cm/s LVOT diam:     2.00 cm   LV E/e' lateral: 6.5 LV SV:         46 LV SV Index:   21 LVOT Area:     3.14 cm  RIGHT VENTRICLE RV S prime:     14.90 cm/s TAPSE (M-mode): 2.0 cm LEFT ATRIUM             Index        RIGHT ATRIUM           Index LA diam:        3.70 cm 1.68 cm/m   RA Area:     18.40 cm LA Vol (A2C):   60.7 ml 27.54 ml/m  RA Volume:   55.80 ml  25.31 ml/m LA Vol (A4C):   55.4 ml 25.13 ml/m LA Biplane Vol: 62.5 ml 28.35 ml/m  AORTIC VALVE LVOT Vmax:  93.10 cm/s LVOT Vmean:  66.200 cm/s LVOT VTI:    0.148 m  AORTA Ao Root diam: 3.00 cm Ao Asc diam:  3.10 cm MITRAL VALVE               TRICUSPID VALVE MV Area (PHT): 2.57 cm    TR Peak grad:   6.9 mmHg MV Decel Time: 295 msec    TR Vmax:        131.00 cm/s MV E velocity: 44.60 cm/s MV A velocity: 48.50 cm/s  SHUNTS MV E/A ratio:  0.92        Systemic VTI:  0.15 m                            Systemic Diam: 2.00 cm Chilton Si MD Electronically signed by Chilton Si MD Signature Date/Time: 01/09/2021/6:36:19 PM    Final     Labs: BNP (last 3 results) No results for input(s): BNP in the last 8760 hours. Basic Metabolic Panel: Recent Labs  Lab 01/08/21 1355 01/08/21 2238 01/09/21 0840 01/10/21 0654 01/11/21 0145 01/12/21 0256 01/13/21 0257  NA 133* 134* 135 135 136 138 132*  K 3.4* 3.3* 3.2* 3.6 3.7 4.1 4.1  CL 99 101 105 110 108 106 105  CO2 21* 20* 23 20* 23 22 21*  GLUCOSE 111* 98 104* 105* 103* 95 96  BUN 13 7 6  5* <5* 7 5*  CREATININE 1.10* 1.09* 1.10* 1.09* 1.06* 1.02* 0.95  CALCIUM 8.2* 8.1* 7.9* 7.8* 8.7* 8.9 8.4*  MG 2.2 1.9 2.3 1.9  --  1.7  --    Liver Function Tests: Recent Labs  Lab 01/08/21 1355 01/09/21 0840 01/11/21 0145  AST 93* 104* 93*  ALT 67* 60* 60*  ALKPHOS 99 81 74  BILITOT 1.2 1.7* 1.2   PROT 7.8 6.3* 6.1*  ALBUMIN 4.1 3.1* 2.8*   No results for input(s): LIPASE, AMYLASE in the last 168 hours. No results for input(s): AMMONIA in the last 168 hours. CBC: Recent Labs  Lab 01/08/21 1355  WBC 8.3  NEUTROABS 6.6  HGB 12.5  HCT 39.8  MCV 84.1  PLT 350   Cardiac Enzymes: No results for input(s): CKTOTAL, CKMB, CKMBINDEX, TROPONINI in the last 168 hours. BNP: Invalid input(s): POCBNP CBG: No results for input(s): GLUCAP in the last 168 hours. D-Dimer No results for input(s): DDIMER in the last 72 hours. Hgb A1c No results for input(s): HGBA1C in the last 72 hours. Lipid Profile No results for input(s): CHOL, HDL, LDLCALC, TRIG, CHOLHDL, LDLDIRECT in the last 72 hours. Thyroid function studies Recent Labs    01/11/21 0145  TSH 4.922*   Anemia work up No results for input(s): VITAMINB12, FOLATE, FERRITIN, TIBC, IRON, RETICCTPCT in the last 72 hours. Urinalysis    Component Value Date/Time   COLORURINE YELLOW 10/06/2020 1824   APPEARANCEUR CLEAR 10/06/2020 1824   LABSPEC 1.004 (L) 10/06/2020 1824   PHURINE 5.0 10/06/2020 1824   GLUCOSEU NEGATIVE 10/06/2020 1824   HGBUR NEGATIVE 10/06/2020 1824   BILIRUBINUR NEGATIVE 10/06/2020 1824   KETONESUR NEGATIVE 10/06/2020 1824   PROTEINUR NEGATIVE 10/06/2020 1824   NITRITE NEGATIVE 10/06/2020 1824   LEUKOCYTESUR NEGATIVE 10/06/2020 1824   Sepsis Labs Invalid input(s): PROCALCITONIN,  WBC,  LACTICIDVEN Microbiology Recent Results (from the past 240 hour(s))  Resp Panel by RT-PCR (Flu A&B, Covid) Nasopharyngeal Swab     Status: None   Collection Time: 01/08/21  3:40 PM   Specimen:  Nasopharyngeal Swab; Nasopharyngeal(NP) swabs in vial transport medium  Result Value Ref Range Status   SARS Coronavirus 2 by RT PCR NEGATIVE NEGATIVE Final    Comment: (NOTE) SARS-CoV-2 target nucleic acids are NOT DETECTED.  The SARS-CoV-2 RNA is generally detectable in upper respiratory specimens during the acute phase of  infection. The lowest concentration of SARS-CoV-2 viral copies this assay can detect is 138 copies/mL. A negative result does not preclude SARS-Cov-2 infection and should not be used as the sole basis for treatment or other patient management decisions. A negative result may occur with  improper specimen collection/handling, submission of specimen other than nasopharyngeal swab, presence of viral mutation(s) within the areas targeted by this assay, and inadequate number of viral copies(<138 copies/mL). A negative result must be combined with clinical observations, patient history, and epidemiological information. The expected result is Negative.  Fact Sheet for Patients:  BloggerCourse.com  Fact Sheet for Healthcare Providers:  SeriousBroker.it  This test is no t yet approved or cleared by the [place] FDA and  has been authorized for detection and/or diagnosis of SARS-CoV-2 by FDA under an Emergency Use Authorization (EUA). This EUA will remain  in effect (meaning this test can be used) for the duration of the COVID-19 declaration under Section 564(b)(1) of the Act, 21 U.S.C.section 360bbb-3(b)(1), unless the authorization is terminated  or revoked sooner.       Influenza A by PCR NEGATIVE NEGATIVE Final   Influenza B by PCR NEGATIVE NEGATIVE Final    Comment: (NOTE) The Xpert Xpress SARS-CoV-2/FLU/RSV plus assay is intended as an aid in the diagnosis of influenza from Nasopharyngeal swab specimens and should not be used as a sole basis for treatment. Nasal washings and aspirates are unacceptable for Xpert Xpress SARS-CoV-2/FLU/RSV testing.  Fact Sheet for Patients: BloggerCourse.com  Fact Sheet for Healthcare Providers: SeriousBroker.it  This test is not yet approved or cleared by the [place] FDA and has been authorized for detection and/or diagnosis of SARS-CoV-2  by FDA under an Emergency Use Authorization (EUA). This EUA will remain in effect (meaning this test can be used) for the duration of the COVID-19 declaration under Section 564(b)(1) of the Act, 21 U.S.C. section 360bbb-3(b)(1), unless the authorization is terminated or revoked.  Performed at [facility], [address] [place], [place] [address]   Surgical pcr screen     Status: Abnormal   Collection Time: 01/11/21  9:24 PM   Specimen: Nasal Mucosa; Nasal Swab  Result Value Ref Range Status   MRSA, PCR POSITIVE (A) NEGATIVE Final    Comment: RESULT CALLED TO, READ BACK BY AND VERIFIED WITH: RN SONYA B. 01/11/21@23 :01 BY TW    Staphylococcus aureus POSITIVE (A) NEGATIVE Final    Comment: (NOTE) The Xpert SA Assay (FDA approved for NASAL specimens in patients 74 years of age and older), is one component of a comprehensive surveillance program. It is not intended to diagnose infection nor to guide or monitor treatment. Performed at [facility], [address], [place], [place] [address]      Time coordinating discharge: 25 minutes  SIGNED: Lanae Boast, MD  [facility] 01/13/2021, 9:41 AM  If 7PM-7AM, please contact night-coverage [email or web address]

## 2021-01-13 NOTE — Progress Notes (Addendum)
Progress Note  Patient Name: Sharon Hanson Date of Encounter: 01/13/2021  [facility] Cardiologist: None   Subjective   No complaints  Inpatient Medications    Scheduled Meds:  calcium carbonate  1 tablet Oral TID   [START ON 01/14/2021] Chlorhexidine Gluconate Cloth  6 each Topical Q0600   levETIRAcetam  500 mg Oral BID   magnesium oxide  400 mg Oral BID   metoprolol tartrate  25 mg Oral BID   mupirocin ointment  1 application Nasal BID   potassium chloride  20 mEq Oral Daily   Ensure Max Protein  11 oz Oral BID   sodium chloride flush  3 mL Intravenous Q12H   sodium chloride flush  3 mL Intravenous Q12H   Continuous Infusions:  sodium chloride 10 mL/hr at 01/13/21 [id number]    ceFAZolin (ANCEF) IV 1 g (01/13/21 0622)    PRN Meds: sodium chloride, acetaminophen **OR** acetaminophen, hydrALAZINE, LORazepam, LORazepam, ondansetron (ZOFRAN) IV, ondansetron (ZOFRAN) IV, sodium chloride flush   Vital Signs    Vitals:   01/12/21 2247 01/12/21 2257 01/13/21 0006 01/13/21 0616  BP: 126/86 133/85 136/64 (!) 176/95  Pulse: 84 85 79 77  Resp: 20  19 16   Temp:   98.5 F (36.9 C) 98.9 F (37.2 C)  TempSrc:   Oral Oral  SpO2: 99% 99% 97% 98%  Weight:    105 kg  Height:        Intake/Output Summary (Last 24 hours) at 01/13/2021 0817 Last data filed at 01/13/2021 0500 Gross per 24 hour  Intake 798.21 ml  Output 600 ml  Net 198.21 ml    Last 3 Weights 01/13/2021 01/12/2021 01/08/2021  Weight (lbs) 231 lb 6.4 oz 235 lb 3.7 oz 245 lb  Weight (kg) 104.962 kg 106.7 kg 111.131 kg      Telemetry    Sinus rhythm - Personally Reviewed  ECG    SR 77bpm, no acute changes- Personally Reviewed  Physical Exam   GEN: No acute distress.   Neck: No JVD, righ IJ site is stable Cardiac: RRR, no murmurs, rubs, or gallops.  B/l groin sites are stable Respiratory: CTA b/l. GI: Soft, nontender, non-distended  MS: No edema; No deformity. Neuro:  Nonfocal  Psych: Normal  affect   ICD site: stable without hematoma  Labs    High Sensitivity Troponin:  No results for input(s): TROPONINIHS in the last 720 hours.   Chemistry Recent Labs  Lab 01/08/21 1355 01/08/21 2238 01/09/21 0840 01/10/21 0654 01/11/21 0145 01/12/21 0256 01/13/21 0257  NA 133*   < > 135 135 136 138 132*  K 3.4*   < > 3.2* 3.6 3.7 4.1 4.1  CL 99   < > 105 110 108 106 105  CO2 21*   < > 23 20* 23 22 21*  GLUCOSE 111*   < > 104* 105* 103* 95 96  BUN 13   < > 6 5* <5* 7 5*  CREATININE 1.10*   < > 1.10* 1.09* 1.06* 1.02* 0.95  CALCIUM 8.2*   < > 7.9* 7.8* 8.7* 8.9 8.4*  MG 2.2   < > 2.3 1.9  --  1.7  --   PROT 7.8  --  6.3*  --  6.1*  --   --   ALBUMIN 4.1  --  3.1*  --  2.8*  --   --   AST 93*  --  104*  --  93*  --   --   ALT  67*  --  60*  --  60*  --   --   ALKPHOS 99  --  81  --  74  --   --   BILITOT 1.2  --  1.7*  --  1.2  --   --   GFRNONAA >60   < > >60 >60 >60 >60 >60  ANIONGAP 13   < > 7 5 5 10 6    < > = values in this interval not displayed.    Lipids No results for input(s): CHOL, TRIG, HDL, LABVLDL, LDLCALC, CHOLHDL in the last 168 hours.  Hematology Recent Labs  Lab 01/08/21 1355  WBC 8.3  RBC 4.73  HGB 12.5  HCT 39.8  MCV 84.1  MCH 26.4  MCHC 31.4  RDW 21.2*  PLT 350   Thyroid  Recent Labs  Lab 01/11/21 0145  TSH 4.922*  FREET4 0.83    BNPNo results for input(s): BNP, PROBNP in the last 168 hours.  DDimer No results for input(s): DDIMER in the last 168 hours.   Radiology    DG Chest 2 View Result Date: 01/13/2021 CLINICAL DATA:  Cardiac device in-situ. EXAM: CHEST - 2 VIEW COMPARISON:  Chest x-ray 01/08/2021, 10/06/2020. FINDINGS: Cardiac pacer with lead tips over the right atrium and right ventricle. Stable mild prominence of the great vessels. Heart size normal. Low lung volumes. No focal infiltrate. No pleural effusion or pneumothorax. No bony abnormality. IMPRESSION: Cardiac pacer noted with lead tips over the right atrium and right  ventricle. No pneumothorax. No acute pulmonary disease. Electronically Signed   By: 10/08/2020  Register M.D.   On: 01/13/2021 07:20    Cardiac Studies   01/12/21: EPS CONCLUSIONS:  1. Sinus rhythm upon presentation.  2.  Negative EP study  C.MRI: 01/12/21 IMPRESSION: 1.  Normal LV size and function EF 53% 2.  Normal RV size and function no dysplasia 3.  Normal parametric measures 4.  No delayed gadolinium uptake 5.  Trivial LV posterior pericardial effusion 6.  Normal cardiac valves 7.  Normal ascending aortic root 3.0 cm  01/09/21: TTE  1. Left ventricular ejection fraction, by estimation, is 55 to 60%. The  left ventricle has normal function. The left ventricle has no regional  wall motion abnormalities. There is mild concentric left ventricular  hypertrophy. Left ventricular diastolic  parameters are consistent with Grade I diastolic dysfunction (impaired  relaxation).   2. Right ventricular systolic function is normal. The right ventricular  size is normal. There is normal pulmonary artery systolic pressure.   3. The mitral valve is normal in structure. Trivial mitral valve  regurgitation. No evidence of mitral stenosis.   4. The aortic valve is tricuspid. Aortic valve regurgitation is not  visualized. No aortic stenosis is present.   5. The inferior vena cava is normal in size with greater than 50%  respiratory variability, suggesting right atrial pressure of 3 mmHg.   Patient Profile     40 y.o. 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  Also found to have a wide-complex tachycardia.  Assessment & Plan    Wide-complex tachycardia:  VT Tachycardia appears to be due to ventricular tachycardia, though with a normal echo and her age  MRI with out etiology of her VT EPS negative for inducible arrhythmias/accessory pathway S/p ICD implant yesterday  EP sites (R-IJ, b/l groins) are stable without bleeding,  hematoma, tenderness ICD site also stable, no hematoma Device check this AM  with intact function CXR this AM with stable lead position, no ptx Wound care and activity restrictions were discussed with the patient She is aware no driving, [place] law 55mo  EP follow up Sharon Hanson be arranged  OK to discharge from EP perspective once medically ready otherwise We Damoni Causby sign off though remain available, please recall if needed  For questions or updates, please contact [place] Please consult [email or web address] for contact info under        Signed, Baldwin Jamaica, PA-C  01/13/2021, 8:17 AM    I have seen and examined this patient with Tommye Standard.  Agree with above, note added to reflect my findings.  On exam, RRR, no murmurs.  She is now status post [employer] ICD for wide complex tachycardia likely VT.  Device functioning appropriately.  Chest x-ray and interrogation without issue.  OK for discharge today with follow-up in device clinic.  Laurabelle Gorczyca M. Nieves Chapa MD 01/13/2021 10:11 AM

## 2021-01-28 ENCOUNTER — Ambulatory Visit: Payer: Medicaid Other

## 2021-01-29 ENCOUNTER — Ambulatory Visit (INDEPENDENT_AMBULATORY_CARE_PROVIDER_SITE_OTHER): Payer: Self-pay

## 2021-01-29 ENCOUNTER — Other Ambulatory Visit: Payer: Self-pay

## 2021-01-29 DIAGNOSIS — R55 Syncope and collapse: Secondary | ICD-10-CM

## 2021-01-29 DIAGNOSIS — R000 Tachycardia, unspecified: Secondary | ICD-10-CM

## 2021-01-29 DIAGNOSIS — I471 Supraventricular tachycardia: Secondary | ICD-10-CM

## 2021-01-29 LAB — CUP PACEART INCLINIC DEVICE CHECK
Battery Remaining Longevity: 128 mo
Battery Voltage: 3.09 V
Brady Statistic AP VP Percent: 0.01 %
Brady Statistic AP VS Percent: 12.76 %
Brady Statistic AS VP Percent: 0.03 %
Brady Statistic AS VS Percent: 87.2 %
Brady Statistic RA Percent Paced: 12.72 %
Brady Statistic RV Percent Paced: 0.04 %
Date Time Interrogation Session: 20221208092742
HighPow Impedance: 53 Ohm
Implantable Lead Implant Date: 20221121
Implantable Lead Implant Date: 20221121
Implantable Lead Location: 753859
Implantable Lead Location: 753860
Implantable Lead Model: 5076
Implantable Pulse Generator Implant Date: 20221121
Lead Channel Impedance Value: 399 Ohm
Lead Channel Impedance Value: 494 Ohm
Lead Channel Impedance Value: 513 Ohm
Lead Channel Pacing Threshold Amplitude: 0.5 V
Lead Channel Pacing Threshold Amplitude: 0.75 V
Lead Channel Pacing Threshold Pulse Width: 0.4 ms
Lead Channel Pacing Threshold Pulse Width: 0.4 ms
Lead Channel Sensing Intrinsic Amplitude: 12.875 mV
Lead Channel Sensing Intrinsic Amplitude: 19.25 mV
Lead Channel Sensing Intrinsic Amplitude: 2.5 mV
Lead Channel Sensing Intrinsic Amplitude: 2.625 mV
Lead Channel Setting Pacing Amplitude: 3.5 V
Lead Channel Setting Pacing Amplitude: 3.5 V
Lead Channel Setting Pacing Pulse Width: 0.4 ms
Lead Channel Setting Sensing Sensitivity: 0.3 mV

## 2021-01-29 NOTE — Patient Instructions (Signed)
   After Your ICD (Implantable Cardiac Defibrillator)    Monitor your defibrillator site for redness, swelling, and drainage. Call the device clinic at [phone] if you experience these symptoms or fever/chills.  Your incision was closed with Steri-strips or staples:  You may shower 7 days after your procedure and wash your incision with soap and water. Avoid lotions, ointments, or perfumes over your incision until it is well-healed.  You may use a hot tub or a pool after your wound check appointment if the incision is completely closed.  Do not lift, push or pull greater than 10 pounds with the affected arm until 6 weeks after your procedure. There are no other restrictions in arm movement after your wound check appointment.  Your ICD is MRI compatible.  Your ICD is designed to protect you from life threatening heart rhythms. Because of this, you may receive a shock.   1 shock with no symptoms:  Call the office during business hours. 1 shock with symptoms (chest pain, chest pressure, dizziness, lightheadedness, shortness of breath, overall feeling unwell):  Call 911. If you experience 2 or more shocks in 24 hours:  Call 911. If you receive a shock, you should not drive.  [place] DMV - no driving for 6 months if you receive appropriate therapy from your ICD.   ICD Alerts:  Some alerts are vibratory and others beep. These are NOT emergencies. Please call our office to let us know. If this occurs at night or on weekends, it can wait until the next business day. Send a remote transmission.   Remote monitoring is used to monitor your ICD from home. This monitoring is scheduled every 91 days by our office. It allows us to keep an eye on the functioning of your device to ensure it is working properly. You will routinely see your Electrophysiologist annually (more often if necessary).  

## 2021-01-29 NOTE — Progress Notes (Signed)
Wound check appointment. Steri-strips removed prior to OV. Wound without redness or edema. Incision edges approximated, wound well healed, small scab noted on patients right inner corner, patient educated not to pull scab off. Normal device function. Thresholds, sensing, and impedances consistent with implant measurements. Device programmed at 3.5V for extra safety margin until 3 month visit. Histogram distribution appropriate for patient and level of activity. No mode switches or ventricular arrhythmias noted. Patient educated about wound care, arm mobility, lifting restrictions, shock plan. ROV w/ WC 04/30/21

## 2021-02-03 ENCOUNTER — Emergency Department (HOSPITAL_COMMUNITY): Payer: Medicaid - Out of State

## 2021-02-03 ENCOUNTER — Other Ambulatory Visit: Payer: Self-pay

## 2021-02-03 ENCOUNTER — Emergency Department (HOSPITAL_COMMUNITY)
Admission: EM | Admit: 2021-02-03 | Discharge: 2021-02-03 | Disposition: A | Discharge status: Home / Self Care | Payer: Medicaid - Out of State | Attending: Emergency Medicine | Admitting: Emergency Medicine

## 2021-02-03 ENCOUNTER — Encounter (HOSPITAL_COMMUNITY): Payer: Self-pay | Admitting: Emergency Medicine

## 2021-02-03 DIAGNOSIS — R000 Tachycardia, unspecified: Secondary | ICD-10-CM | POA: Insufficient documentation

## 2021-02-03 DIAGNOSIS — I10 Essential (primary) hypertension: Secondary | ICD-10-CM | POA: Insufficient documentation

## 2021-02-03 DIAGNOSIS — E860 Dehydration: Secondary | ICD-10-CM | POA: Diagnosis not present

## 2021-02-03 DIAGNOSIS — Z87891 Personal history of nicotine dependence: Secondary | ICD-10-CM | POA: Insufficient documentation

## 2021-02-03 DIAGNOSIS — N3000 Acute cystitis without hematuria: Secondary | ICD-10-CM | POA: Insufficient documentation

## 2021-02-03 DIAGNOSIS — R1032 Left lower quadrant pain: Secondary | ICD-10-CM | POA: Diagnosis present

## 2021-02-03 DIAGNOSIS — Z79899 Other long term (current) drug therapy: Secondary | ICD-10-CM | POA: Insufficient documentation

## 2021-02-03 DIAGNOSIS — R0602 Shortness of breath: Secondary | ICD-10-CM | POA: Diagnosis not present

## 2021-02-03 DIAGNOSIS — R112 Nausea with vomiting, unspecified: Secondary | ICD-10-CM | POA: Diagnosis not present

## 2021-02-03 LAB — COMPREHENSIVE METABOLIC PANEL
ALT: 90 U/L — ABNORMAL HIGH (ref 0–44)
AST: 121 U/L — ABNORMAL HIGH (ref 15–41)
Albumin: 4.3 g/dL (ref 3.5–5.0)
Alkaline Phosphatase: 89 U/L (ref 38–126)
Anion gap: 17 — ABNORMAL HIGH (ref 5–15)
BUN: 16 mg/dL (ref 6–20)
CO2: 14 mmol/L — ABNORMAL LOW (ref 22–32)
Calcium: 9.7 mg/dL (ref 8.9–10.3)
Chloride: 99 mmol/L (ref 98–111)
Creatinine, Ser: 1.24 mg/dL — ABNORMAL HIGH (ref 0.44–1.00)
GFR, Estimated: 57 mL/min — ABNORMAL LOW (ref >60)
Glucose, Bld: 113 mg/dL — ABNORMAL HIGH (ref 70–99)
Potassium: 4 mmol/L (ref 3.5–5.1)
Sodium: 130 mmol/L — ABNORMAL LOW (ref 135–145)
Total Bilirubin: 2.3 mg/dL — ABNORMAL HIGH (ref 0.3–1.2)
Total Protein: 8.3 g/dL — ABNORMAL HIGH (ref 6.5–8.1)

## 2021-02-03 LAB — CBC
HCT: 42.4 % (ref 36.0–46.0)
Hemoglobin: 13.3 g/dL (ref 12.0–15.0)
MCH: 26.2 pg (ref 26.0–34.0)
MCHC: 31.4 g/dL (ref 30.0–36.0)
MCV: 83.6 fL (ref 80.0–100.0)
Platelets: 324 10*3/uL (ref 150–400)
RBC: 5.07 MIL/uL (ref 3.87–5.11)
RDW: 18.9 % — ABNORMAL HIGH (ref 11.5–15.5)
WBC: 8.9 10*3/uL (ref 4.0–10.5)
nRBC: 0 % (ref 0.0–0.2)

## 2021-02-03 LAB — TROPONIN I (HIGH SENSITIVITY)
Troponin I (High Sensitivity): 9 ng/L (ref <18)
Troponin I (High Sensitivity): 8 ng/L (ref <18)

## 2021-02-03 LAB — URINALYSIS, ROUTINE W REFLEX MICROSCOPIC
Glucose, UA: NEGATIVE mg/dL
Hgb urine dipstick: NEGATIVE
Ketones, ur: >80 mg/dL — AB
Nitrite: POSITIVE — AB
Protein, ur: 100 mg/dL — AB
Specific Gravity, Urine: >1.03 — ABNORMAL HIGH (ref 1.005–1.030)
pH: 6.5 (ref 5.0–8.0)

## 2021-02-03 LAB — URINALYSIS, MICROSCOPIC (REFLEX)

## 2021-02-03 LAB — LIPASE, BLOOD: Lipase: 60 U/L — ABNORMAL HIGH (ref 11–51)

## 2021-02-03 LAB — I-STAT BETA HCG BLOOD, ED (MC, WL, AP ONLY): I-stat hCG, quantitative: <5 m[IU]/mL (ref <5)

## 2021-02-03 LAB — MAGNESIUM: Magnesium: 2.1 mg/dL (ref 1.7–2.4)

## 2021-02-03 LAB — BRAIN NATRIURETIC PEPTIDE: B Natriuretic Peptide: 17.4 pg/mL (ref 0.0–100.0)

## 2021-02-03 IMAGING — CT CT ABD-PELV W/ CM
2 of 4 series · 17 of 46 positions shown, 19 images · IV contrast (omnipaque)
Comparison: None.

CLINICAL DATA: Left lower quadrant abdominal pain

EXAM:
CT ABDOMEN AND PELVIS WITH CONTRAST
TECHNIQUE: Multidetector CT imaging of the abdomen and pelvis was performed
using the standard protocol following bolus administration of
intravenous contrast.
CONTRAST:  99mL OMNIPAQUE IOHEXOL 300 MG/ML  SOLN

[Series 3: abdomen 5.0 · axial · 0.98mm/px · z∈[+782,+1227]mm · 14 of 101 slices shown, 16 images]
[im 6/101  soft-tissue]
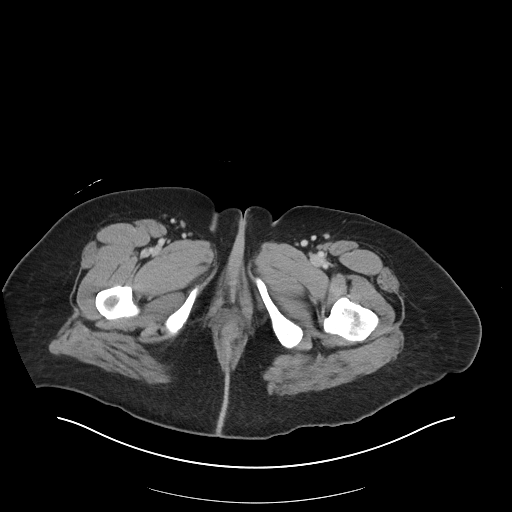
[im 6/101  bone]
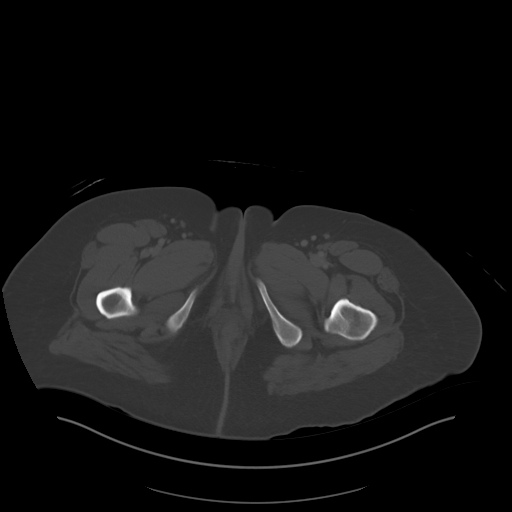
[im 12/101  soft-tissue]
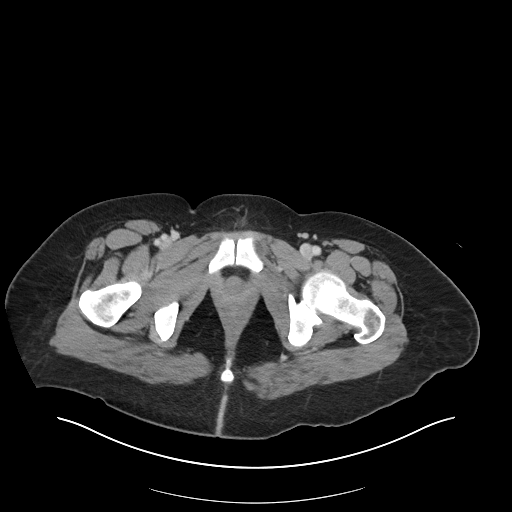
[im 23/101  soft-tissue]
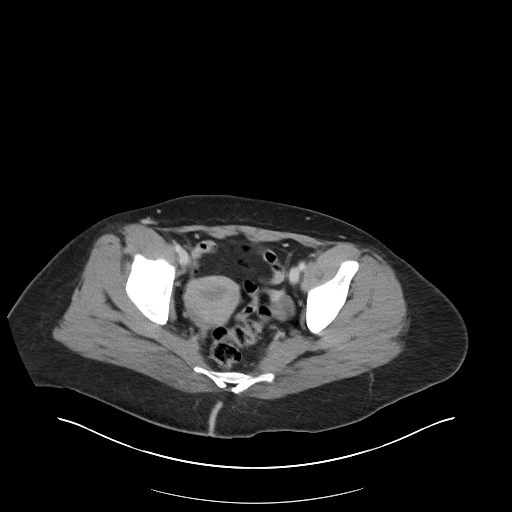
[im 28/101  soft-tissue]
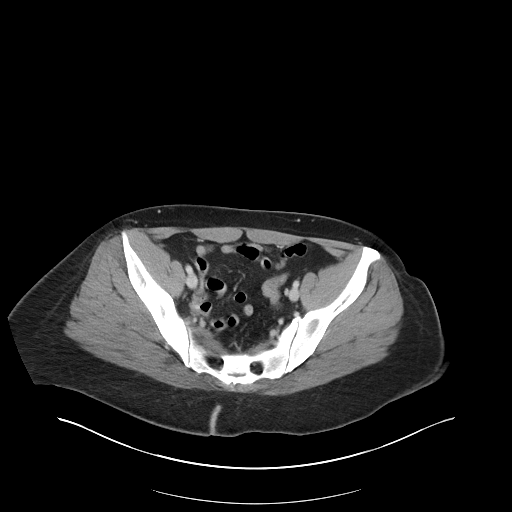
[im 34/101  soft-tissue]
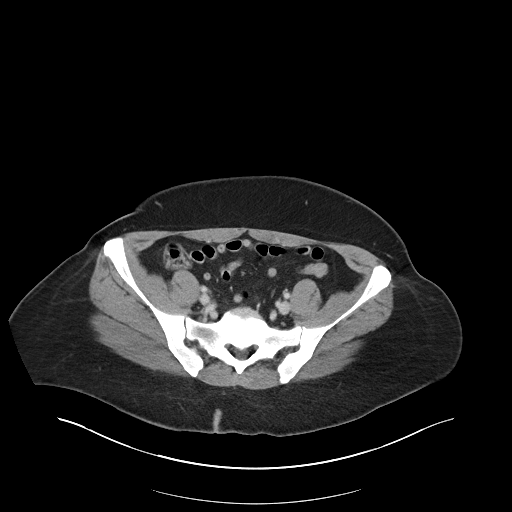
[im 39/101  soft-tissue]
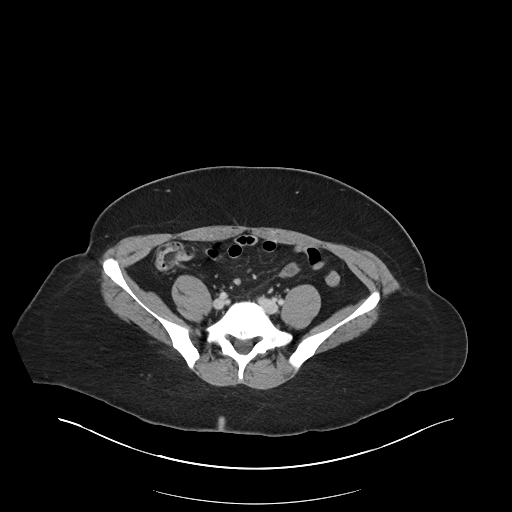
[im 45/101  soft-tissue]
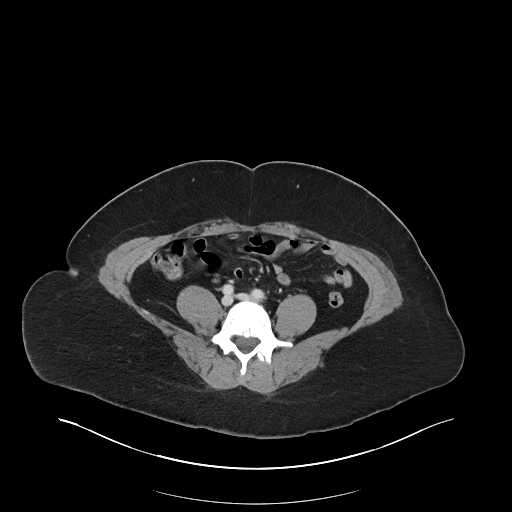
[im 56/101  soft-tissue]
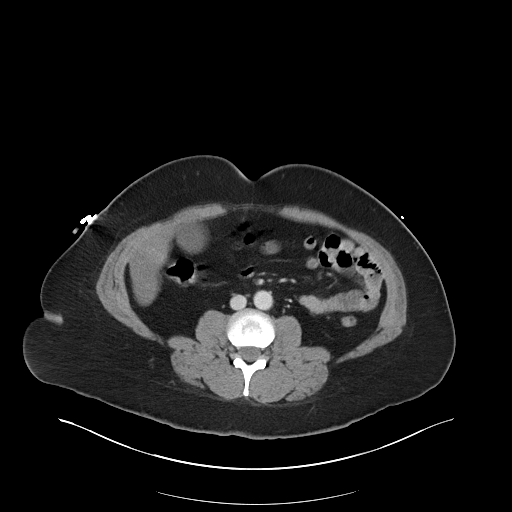
[im 62/101  soft-tissue]
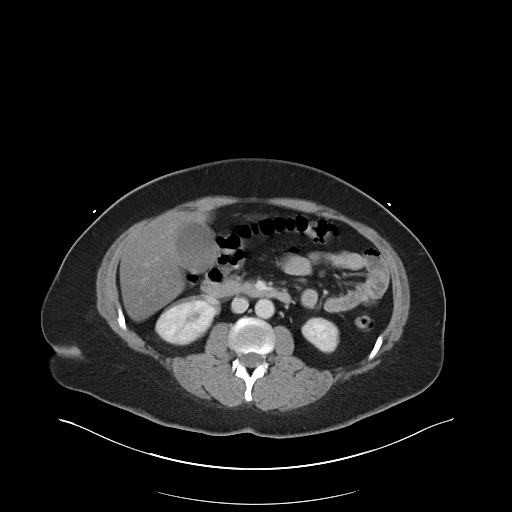
[im 62/101  bone]
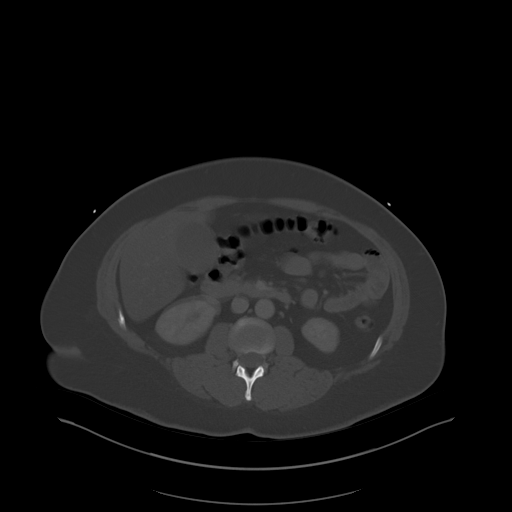
[im 67/101  soft-tissue]
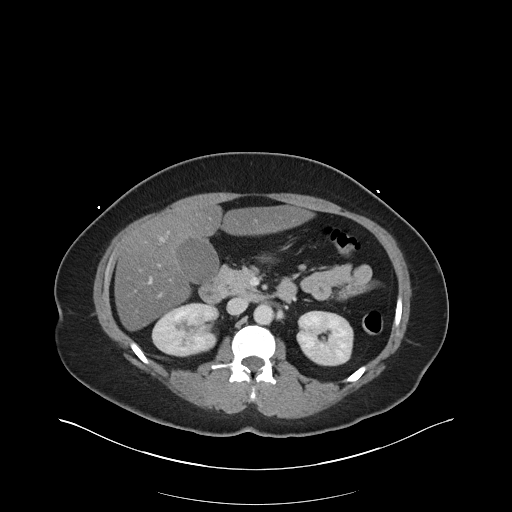
[im 73/101  soft-tissue]
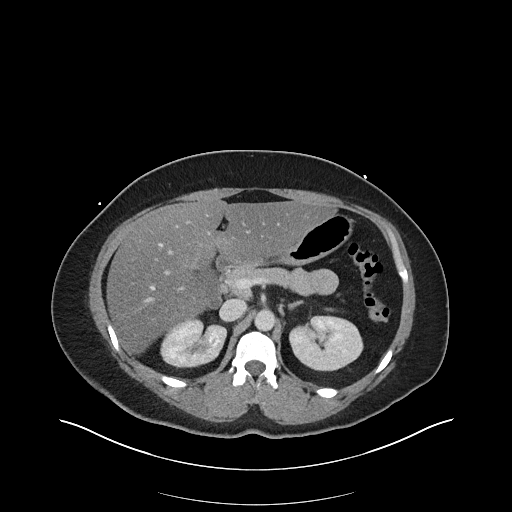
[im 78/101  soft-tissue]
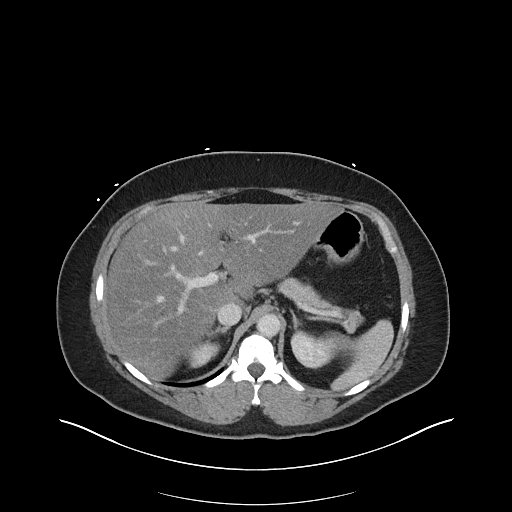
[im 89/101  soft-tissue]
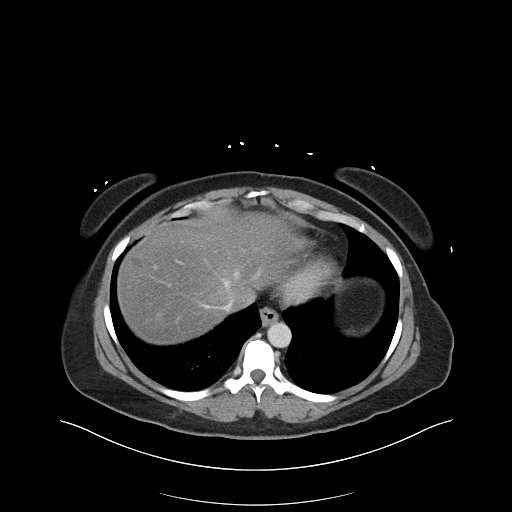
[im 95/101  soft-tissue]
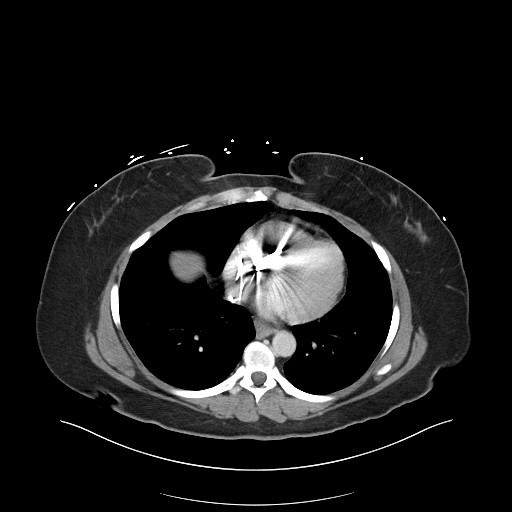

[Series 6: abdomen 3.0 mpr cor · coronal · 0.94mm/px · 3 of 96 slices shown]
[im 32/96  soft-tissue]
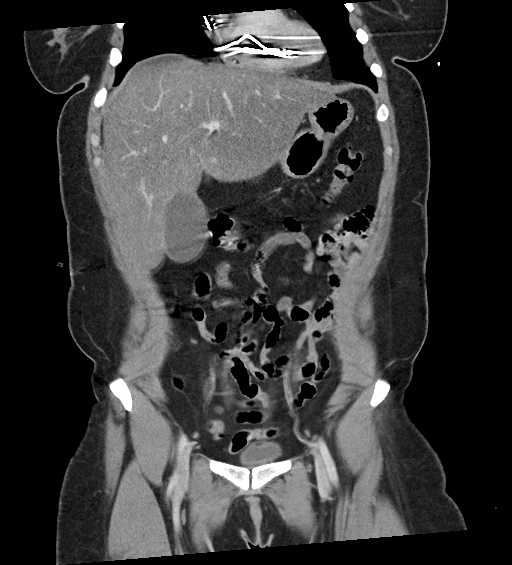
[im 43/96  soft-tissue]
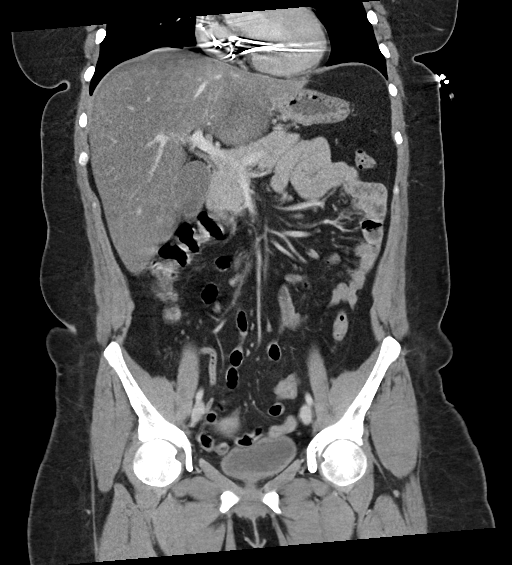
[im 53/96  soft-tissue]
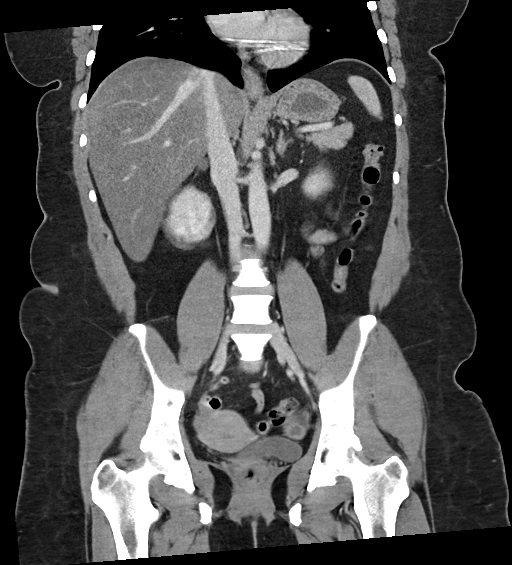

[17 of 46 positions shown; findings below may reference images not displayed]

FINDINGS: Lower chest: No acute pleural or parenchymal lung disease. Dual lead
cardiac pacer identified.

Hepatobiliary: Diffuse hepatic steatosis. Subcentimeter cyst within
the superior right lobe liver. No intrahepatic duct dilation.
Gallbladder is unremarkable.

Pancreas: Unremarkable. No pancreatic ductal dilatation or
surrounding inflammatory changes.

Spleen: Normal in size without focal abnormality.

Adrenals/Urinary Tract: Adrenal glands are unremarkable. Kidneys are
normal, without renal calculi, focal lesion, or hydronephrosis.
Bladder is unremarkable.

Stomach/Bowel: No bowel obstruction or ileus. Normal appendix right
lower quadrant. Sigmoid diverticulosis without evidence of
diverticulitis.

Vascular/Lymphatic: No significant vascular findings are present. No
enlarged abdominal or pelvic lymph nodes.

Reproductive: Uterus and bilateral adnexa are unremarkable. Dominant
follicle left ovary measuring 1.4 cm.

Other: No free fluid or free gas.  No abdominal wall hernia.

Musculoskeletal: No acute or destructive bony lesions. Reconstructed
images demonstrate no additional findings.
IMPRESSION: 1. Sigmoid diverticulosis without diverticulitis.
2. Hepatic steatosis.

## 2021-02-03 IMAGING — CR DG CHEST 2V
2 series · 2 of 2 positions shown · non-contrast
Comparison: [DATE]

CLINICAL DATA: Left side chest pain

EXAM:
CHEST - 2 VIEW

[chest pa]
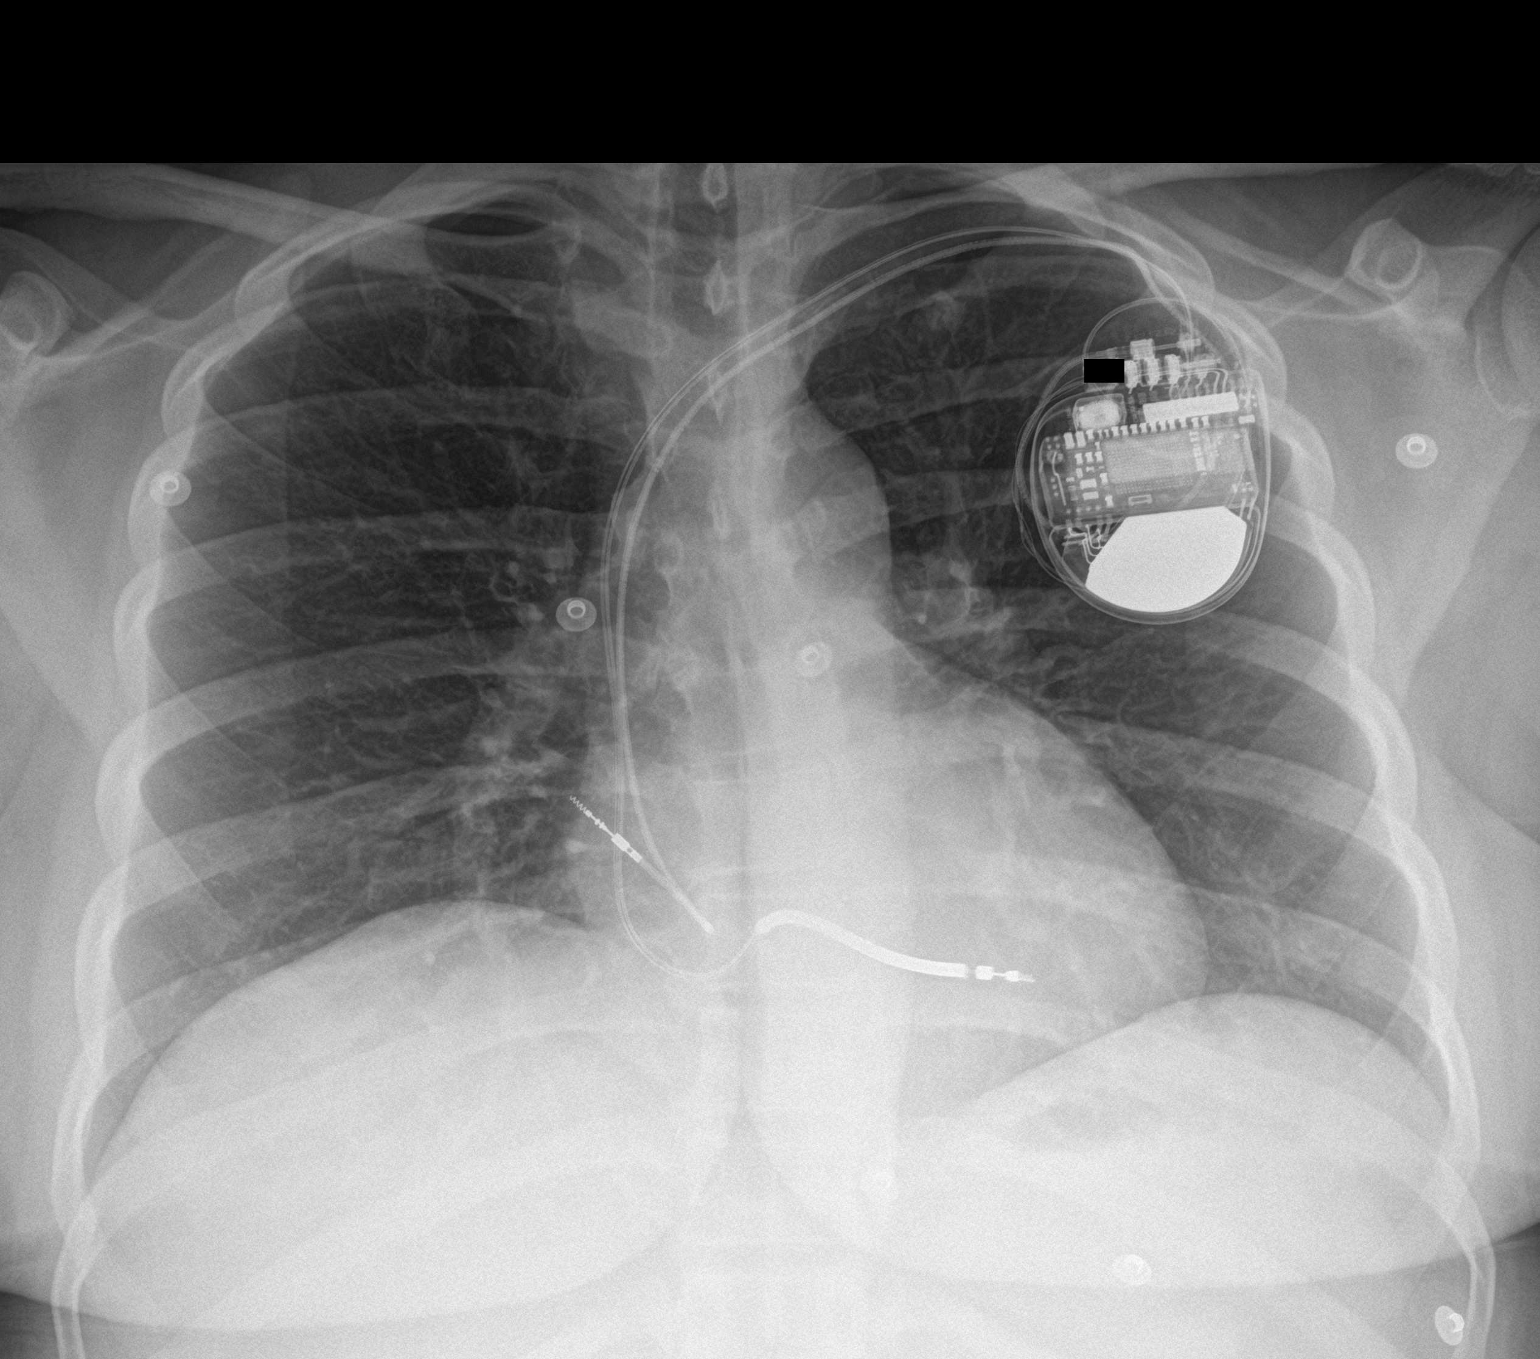

[chest lat]
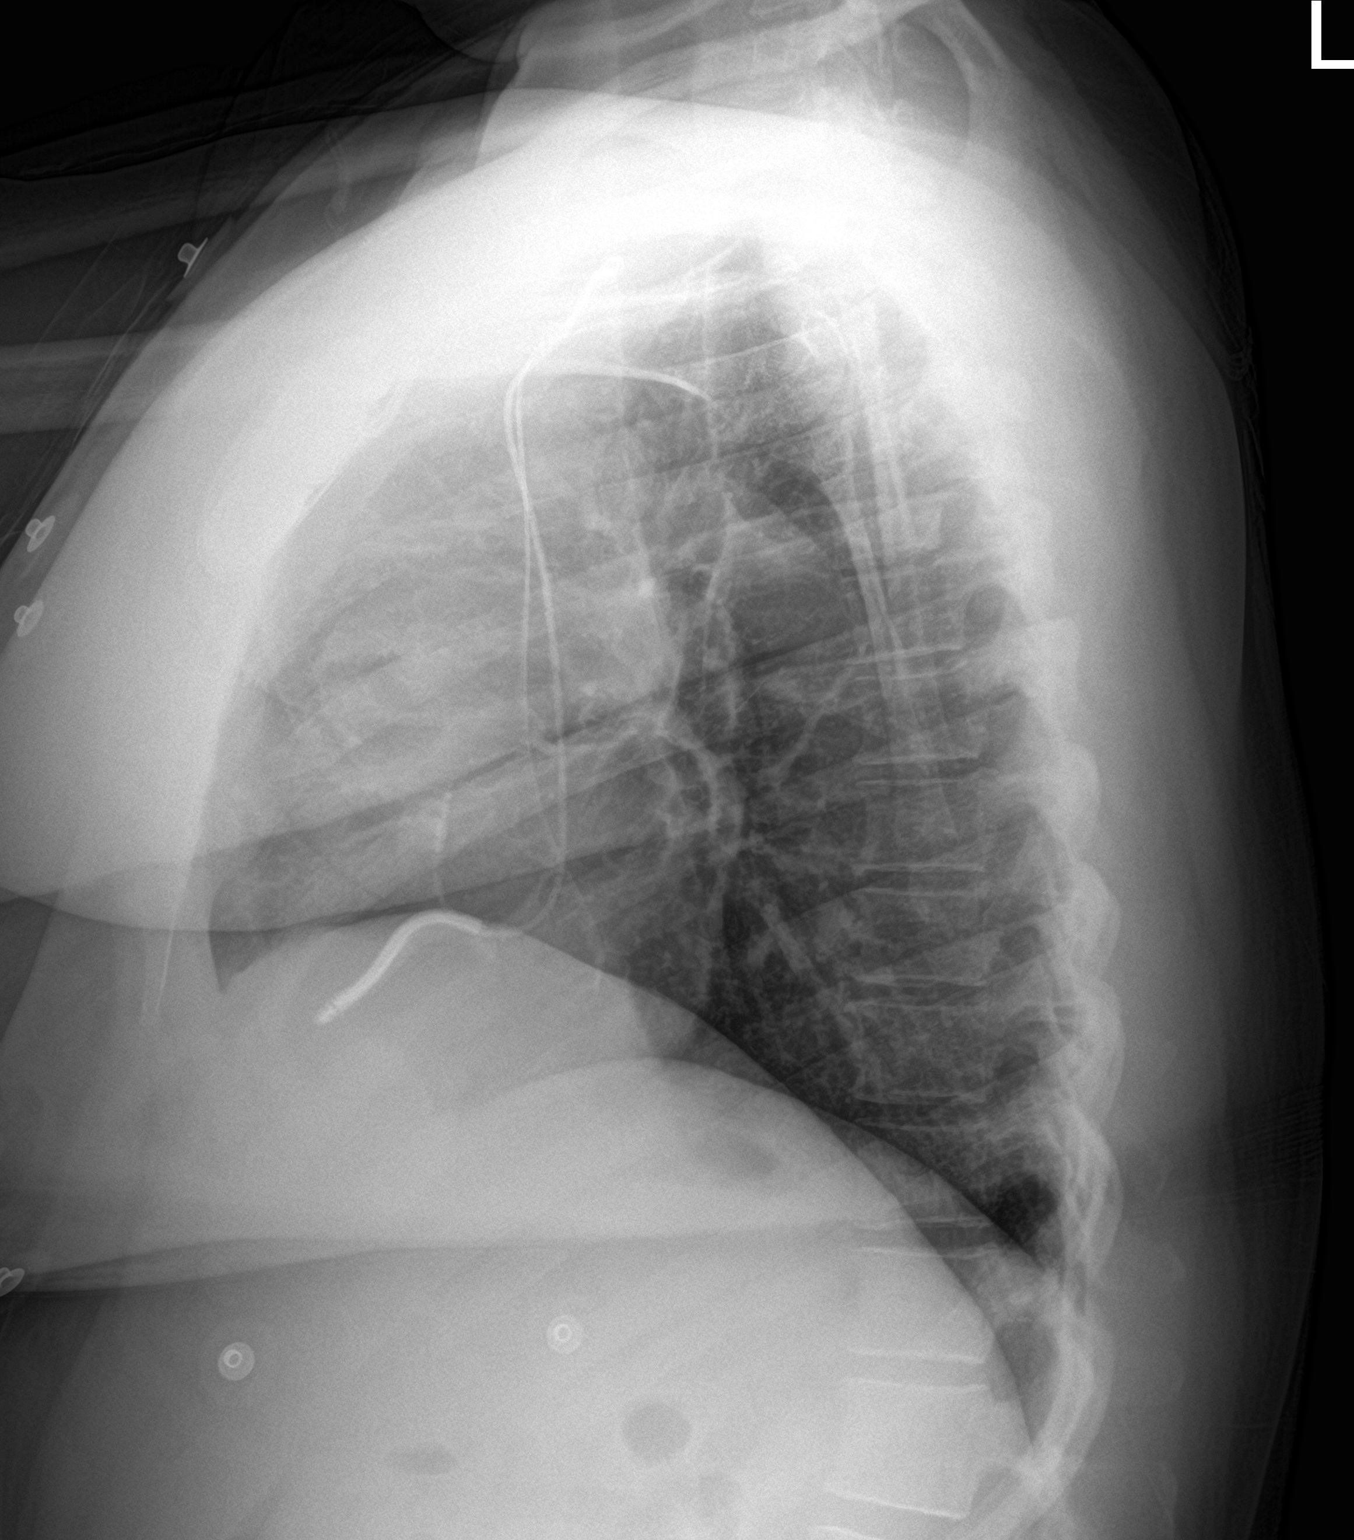

[2 of 2 positions shown; findings below may reference images not displayed]

FINDINGS: Left AICD remains in place, unchanged. Heart and mediastinal
contours are within normal limits. No focal opacities or effusions.
No acute bony abnormality.
IMPRESSION: No active cardiopulmonary disease.

## 2021-02-03 MED ORDER — LACTATED RINGERS IV BOLUS
1000.0000 mL | Freq: Once | INTRAVENOUS | Status: AC
  Administered 2021-02-03: 1000 mL via INTRAVENOUS

## 2021-02-03 MED ORDER — SODIUM CHLORIDE 0.9 % IV SOLN
1.0000 g | Freq: Once | INTRAVENOUS | Status: AC
  Administered 2021-02-03: 1 g via INTRAVENOUS
  Filled 2021-02-03: qty 10

## 2021-02-03 MED ORDER — IOHEXOL 300 MG/ML  SOLN
99.0000 mL | Freq: Once | INTRAMUSCULAR | Status: AC | PRN
  Administered 2021-02-03: 99 mL via INTRAVENOUS

## 2021-02-03 MED ORDER — CEPHALEXIN 500 MG PO CAPS
500.0000 mg | ORAL_CAPSULE | Freq: Three times a day (TID) | ORAL | 0 refills | Status: AC
Start: 2021-02-03 — End: 2021-02-10
  Filled 2021-02-03: qty 21, 7d supply, fill #0

## 2021-02-03 NOTE — ED Notes (Signed)
Patient verbalizes understanding of d/c instructions. Opportunities for questions and answers were provided. Pt d/c from ED and wheeled to lobby with family.  

## 2021-02-03 NOTE — ED Triage Notes (Signed)
Pt states she had ICD placed approx 2 weeks ago.  Reports pain to L chest at ICD insertion site.  Also reports generalized abd pain, vomiting, and SOB with exertion.  Felt dizzy after her medications but that resolved.  States she feels like she is going to pass out.

## 2021-02-03 NOTE — ED Provider Notes (Signed)
[facility] EMER[facility]ENCY DEPARTMENT Provider Note  CSN: [id number] Arrival date & time: 02/03/21 1501    History Chief Complaint  Patient presents with   Abdominal Pain   Vomiting    Pain @ ICD insertion site    Sharon Hanson is a 40 y.o. female was recently admitted to the hospital for seizures. While in the hospital, she had an episode of wide-complex tachycardia felt to be Vtach. She had a negative seizure workup eventually started on Keppra. She also had an extensive cardiac workup with TTE, Cardiac MRI and EP study all of which were neg and had an AICD placed prior to discharge. She has not had any further seizures or episodes of racing heart/defibrillations. She returns to the hospital today for 3-4 days of nausea, vomiting, LLQ abdominal pain, no diarrhea or constipation. She also feels SOB and having some left sided chest pains, near her AICD but not over the incision. No fever, cough.    Past Medical History:  Diagnosis Date   Hypertension     Past Surgical History:  Procedure Laterality Date   ANKLE SURGERY Right    ELECTROPHYSIOLOGY STUDY N/A 01/12/2021   Procedure: ELECTROPHYSIOLOGY STUDY;  Surgeon: Constance Haw, MD;  Location: [facility];  Service: Cardiovascular;  Laterality: N/A;   ICD IMPLANT N/A 01/12/2021   Procedure: ICD IMPLANT;  Surgeon: Constance Haw, MD;  Location: [facility];  Service: Cardiovascular;  Laterality: N/A;    Family History  Problem Relation Age of Onset   Hypertension Other     Social History   Tobacco Use   Smoking status: Former    Types: Cigars   Smokeless tobacco: Never  Vaping Use   Vaping Use: Never used  Substance Use Topics   Alcohol use: Yes    Comment: socially   Drug use: Never     Home Medications Prior to Admission medications   Medication Sig Start Date End Date Taking? Authorizing Provider  calcium carbonate (TUMS - DOSED IN MG ELEMENTAL CALCIUM) 500 MG chewable tablet Chew 1  tablet (200 mg of elemental calcium total) by mouth 2 (two) times daily. 01/13/21 03/14/21 Yes Antonieta Pert, MD  cephALEXin (KEFLEX) 500 MG capsule Take 1 capsule (500 mg total) by mouth 3 (three) times daily for 7 days. 02/03/21 02/10/21 Yes Truddie Hidden, MD  levETIRAcetam (KEPPRA) 500 MG tablet Take 1 tablet (500 mg total) by mouth 2 (two) times daily. 01/13/21 03/14/21 Yes Antonieta Pert, MD  metoprolol tartrate (LOPRESSOR) 25 MG tablet Take 1 tablet (25 mg total) by mouth 2 (two) times daily. 01/13/21 02/12/21 Yes Antonieta Pert, MD  potassium chloride SA (KLOR-CON) 20 MEQ tablet Take 1 tablet (20 mEq total) by mouth daily for 7 days. Patient not taking: Reported on 02/03/2021 01/14/21 01/21/21  Antonieta Pert, MD     Allergies    Pineapple   Review of Systems   Review of Systems A comprehensive review of systems was completed and negative except as noted in HPI.    Physical Exam BP 131/88    Pulse 91    Temp 98.2 F (36.8 C) (Oral)    Resp (!) 21    SpO2 100%   Physical Exam Vitals and nursing note reviewed.  Constitutional:      Appearance: Normal appearance.  HENT:     Head: Normocephalic and atraumatic.     Nose: Nose normal.     Mouth/Throat:     Mouth: Mucous membranes are moist.  Eyes:     Extraocular Movements: Extraocular movements intact.     Conjunctiva/sclera: Conjunctivae normal.  Cardiovascular:     Rate and Rhythm: Normal rate.     Comments: AICD in place, wound is CDI Pulmonary:     Effort: Pulmonary effort is normal.     Breath sounds: Normal breath sounds.  Abdominal:     General: Abdomen is flat.     Palpations: Abdomen is soft.     Tenderness: There is abdominal tenderness in the left lower quadrant. There is no guarding. Negative signs include Murphy's sign and McBurney's sign.  Musculoskeletal:        General: No swelling. Normal range of motion.     Cervical back: Neck supple.  Skin:    General: Skin is warm and dry.  Neurological:     General: No  focal deficit present.     Mental Status: She is alert.  Psychiatric:        Mood and Affect: Mood normal.     ED Results / Procedures / Treatments   Labs (all labs ordered are listed, but only abnormal results are displayed) Labs Reviewed  CBC - Abnormal; Notable for the following components:      Result Value   RDW 18.9 (*)    All other components within normal limits  LIPASE, BLOOD - Abnormal; Notable for the following components:   Lipase 60 (*)    All other components within normal limits  COMPREHENSIVE METABOLIC PANEL - Abnormal; Notable for the following components:   Sodium 130 (*)    CO2 14 (*)    Glucose, Bld 113 (*)    Creatinine, Ser 1.24 (*)    Total Protein 8.3 (*)    AST 121 (*)    ALT 90 (*)    Total Bilirubin 2.3 (*)    GFR, Estimated 57 (*)    Anion gap 17 (*)    All other components within normal limits  URINALYSIS, ROUTINE W REFLEX MICROSCOPIC - Abnormal; Notable for the following components:   Color, Urine ORANGE (*)    APPearance HAZY (*)    Specific Gravity, Urine >1.030 (*)    Bilirubin Urine LARGE (*)    Ketones, ur >80 (*)    Protein, ur 100 (*)    Nitrite POSITIVE (*)    Leukocytes,Ua TRACE (*)    All other components within normal limits  URINALYSIS, MICROSCOPIC (REFLEX) - Abnormal; Notable for the following components:   Bacteria, UA RARE (*)    All other components within normal limits  BRAIN NATRIURETIC PEPTIDE  MAGNESIUM  I-STAT BETA HCG BLOOD, ED ([facility], [facility], [facility] ONLY)  TROPONIN I (HIGH SENSITIVITY)  TROPONIN I (HIGH SENSITIVITY)    EKG EKG Interpretation  Date/Time:  Tuesday February 03 2021 18:47:48 EST Ventricular Rate:  104 PR Interval:  111 QRS Duration: 92 QT Interval:  345 QTC Calculation: 454 R Axis:   49 Text Interpretation: Sinus tachycardia Ventricular premature complex Repol abnrm suggests ischemia, anterolateral Since last tracing HR improved but T wave changes still present. Confirmed by Susy Frizzle [phone]) on  02/03/2021 6:55:54 PM  Radiology DG Chest 2 View  Result Date: 02/03/2021 CLINICAL DATA:  Left side chest pain EXAM: CHEST - 2 VIEW COMPARISON:  01/13/2021 FINDINGS: Left AICD remains in place, unchanged. Heart and mediastinal contours are within normal limits. No focal opacities or effusions. No acute bony abnormality. IMPRESSION: No active cardiopulmonary disease. Electronically Signed   By: Charlett Nose M.D.  On: 02/03/2021 17:33   CT Abdomen Pelvis W Contrast  Result Date: 02/03/2021 CLINICAL DATA:  Left lower quadrant abdominal pain EXAM: CT ABDOMEN AND PELVIS WITH CONTRAST TECHNIQUE: Multidetector CT imaging of the abdomen and pelvis was performed using the standard protocol following bolus administration of intravenous contrast. CONTRAST:  54mL OMNIPAQUE IOHEXOL 300 MG/ML  SOLN COMPARISON:  None. FINDINGS: Lower chest: No acute pleural or parenchymal lung disease. Dual lead cardiac pacer identified. Hepatobiliary: Diffuse hepatic steatosis. Subcentimeter cyst within the superior right lobe liver. No intrahepatic duct dilation. Gallbladder is unremarkable. Pancreas: Unremarkable. No pancreatic ductal dilatation or surrounding inflammatory changes. Spleen: Normal in size without focal abnormality. Adrenals/Urinary Tract: Adrenal glands are unremarkable. Kidneys are normal, without renal calculi, focal lesion, or hydronephrosis. Bladder is unremarkable. Stomach/Bowel: No bowel obstruction or ileus. Normal appendix right lower quadrant. Sigmoid diverticulosis without evidence of diverticulitis. Vascular/Lymphatic: No significant vascular findings are present. No enlarged abdominal or pelvic lymph nodes. Reproductive: Uterus and bilateral adnexa are unremarkable. Dominant follicle left ovary measuring 1.4 cm. Other: No free fluid or free gas.  No abdominal wall hernia. Musculoskeletal: No acute or destructive bony lesions. Reconstructed images demonstrate no additional findings. IMPRESSION: 1. Sigmoid  diverticulosis without diverticulitis. 2. Hepatic steatosis. Electronically Signed   By: Randa Ngo M.D.   On: 02/03/2021 22:02    Procedures Procedures  Medications Ordered in the ED Medications  lactated ringers bolus 1,000 mL (0 mLs Intravenous Paused 02/03/21 2043)  cefTRIAXone (ROCEPHIN) 1 g in sodium chloride 0.9 % 100 mL IVPB (1 g Intravenous New Bag/Given 02/03/21 2048)  iohexol (OMNIPAQUE) 300 MG/ML solution 99 mL (99 mLs Intravenous Contrast Given 02/03/21 2156)     MDM Rules/Calculators/A&P MDM Patient with recent admission for seizure, complicated by an episode of v-tach now with AICD here mostly for N/V but also having some SOB, worse with exertion. She is well appearing on exam. Abdomen is benign. Labs are pending, EKG with sinus tachycardia and nonspecific T wave changes from previous, CXR is clear. On monitor now, her HR is improved, will recheck EKG to see if T wave changes are rate related.   ED Course  I have reviewed the triage vital signs and the nursing notes.  Pertinent labs & imaging results that were available during my care of the patient were reviewed by me and considered in my medical decision making (see chart for details).  Clinical Course as of 02/03/21 2208  Tue Feb 03, 2021  1958 CBC is normal. First Trop and BNP are neg.  [CS]  2016 Mg is normal. Repeat Trop is unchanged. Awaiting CMP.  [CS]  2024 UA with nitrites, bacteria and WBC, patient denies dysuria but given LLQ abd pain will treat for UTI. Also appears dehydrated with concentrated urine. CMP shows mild increase in Cr as well as a bump in bilirubin which may indicate dehydration as well.   [CS]  2206 CT neg for acute process. Will give Rx for Keflex for likely UTI. Recommend she follow up with PCP for recheck. RTED for any other concerns.  [CS]    Clinical Course User Index [CS] Truddie Hidden, MD    Final Clinical Impression(s) / ED Diagnoses Final diagnoses:  Left lower quadrant  abdominal pain  Acute cystitis without hematuria  Dehydration    Rx / DC Orders ED Discharge Orders          Ordered    cephALEXin (KEFLEX) 500 MG capsule  3 times daily  02/03/21 2208             Pollyann Savoy, MD 02/03/21 [phone]

## 2021-02-03 NOTE — ED Provider Notes (Signed)
Emergency Medicine Provider Triage Evaluation Note  Sharon Hanson , a 40 y.o. female  was evaluated in triage.  Pt complains of left-sided chest pain at the site of the ICD placement.  Recently placed mid November due to V. tach.  She also endorses lightheadedness, nausea, vomiting, dyspnea on exertion.  Denies fever, palpitations.   Review of Systems  Positive: As above Negative: As above  Physical Exam  BP (!) 124/94 (BP Location: Left Arm)    Pulse (!) 114    Temp 98.2 F (36.8 C) (Oral)    Resp 16    SpO2 96%  Gen:   Awake, no distress   Resp:  Normal effort  MSK:   Moves extremities without difficulty  Other:    Medical Decision Making  Medically screening exam initiated at 4:24 PM.  Appropriate orders placed.  Jenine Krisher was informed that the remainder of the evaluation will be completed by another provider, this initial triage assessment does not replace that evaluation, and the importance of remaining in the ED until their evaluation is complete.     Marita Kansas, PA-C 02/03/21 1626    Lorre Nick, MD 02/03/21 [phone]

## 2021-02-04 ENCOUNTER — Other Ambulatory Visit (HOSPITAL_COMMUNITY): Payer: Self-pay

## 2021-02-13 ENCOUNTER — Other Ambulatory Visit (HOSPITAL_COMMUNITY): Payer: Self-pay

## 2021-02-17 ENCOUNTER — Other Ambulatory Visit (HOSPITAL_COMMUNITY): Payer: Self-pay

## 2021-03-05 ENCOUNTER — Ambulatory Visit: Payer: Medicaid - Out of State | Attending: Internal Medicine | Admitting: Internal Medicine

## 2021-03-05 ENCOUNTER — Other Ambulatory Visit: Payer: Self-pay

## 2021-03-05 DIAGNOSIS — F1010 Alcohol abuse, uncomplicated: Secondary | ICD-10-CM

## 2021-03-05 DIAGNOSIS — R7989 Other specified abnormal findings of blood chemistry: Secondary | ICD-10-CM | POA: Insufficient documentation

## 2021-03-05 DIAGNOSIS — I10 Essential (primary) hypertension: Secondary | ICD-10-CM

## 2021-03-05 DIAGNOSIS — Z7689 Persons encountering health services in other specified circumstances: Secondary | ICD-10-CM

## 2021-03-05 DIAGNOSIS — G40909 Epilepsy, unspecified, not intractable, without status epilepticus: Secondary | ICD-10-CM

## 2021-03-05 DIAGNOSIS — E871 Hypo-osmolality and hyponatremia: Secondary | ICD-10-CM

## 2021-03-05 DIAGNOSIS — I471 Supraventricular tachycardia: Secondary | ICD-10-CM

## 2021-03-05 DIAGNOSIS — Z23 Encounter for immunization: Secondary | ICD-10-CM

## 2021-03-05 MED ORDER — METOPROLOL TARTRATE 25 MG PO TABS
25.0000 mg | ORAL_TABLET | Freq: Two times a day (BID) | ORAL | 2 refills | Status: DC
  Filled 2021-03-05 – 2021-03-17 (×2): qty 60, 30d supply, fill #0
  Filled 2021-04-15 (×2): qty 60, 30d supply, fill #1
  Filled 2021-05-18: qty 60, 30d supply, fill #2

## 2021-03-05 MED ORDER — LEVETIRACETAM 500 MG PO TABS
500.0000 mg | ORAL_TABLET | Freq: Two times a day (BID) | ORAL | 0 refills | Status: DC
  Filled 2021-03-05 – 2021-03-17 (×2): qty 60, 30d supply, fill #0
  Filled 2021-04-15: qty 60, 30d supply, fill #1
  Filled 2021-05-18: qty 60, 30d supply, fill #2

## 2021-03-05 NOTE — Progress Notes (Signed)
Virtual Visit via Video Note  I connected with Sharon Hanson on 03/05/2021 at 1:55 PM by a video enabled telemedicine application and verified that I am speaking with the correct person using two identifiers.  Location: Patient: home Provider: office   I discussed the limitations of evaluation and management by telemedicine and the availability of in person appointments. The patient expressed understanding and agreed to proceed.  History of Present Illness: Patient with history of HTN, SVT/VT, seizure disorder, obesity, hepatic steatosis on CT 01/2021, EtOH use This is a new patient visit.  She reports no previous PCP.  Patient was hospitalized back in November with new onset seizure.  Found to be tachycardic post ictal with pulse in the 240s.  She was evaluated by cardiology and there was concern for V. tach.  Patient was loaded with amiodarone.  Cardiac MRI negative, EP study negative.  ICD was placed.  In regards to the seizures, she was placed on Keppra.  MRI of the brain revealed no acute abnormality.  She had partially empty and expanded sella which is often a normal anatomic variant but can be associated with idiopathic intracranial hypertension per radiology report. Patient with noted to have elevated transaminase levels.  These trended down.  However levels were elevated again when she was seen in the emergency room last month.  AST 121/ALT of 90.  Sodium level was also noted to be slightly low at 130.  Today: Seizure disorder: She has not yet seen a neurologist in follow-up.  She is trying to get Medicaid transferred from [place] to [place].  She has not had any seizures since hospitalization.  Taking Keppra twice a day as prescribed.  She has not driven and reports being told that she should not drive until she has been seizure-free for at least 6 months.  HTN: Reports compliance with metoprolol twice a day.  No device to check blood pressure.  She limits salt in the foods.   Some shortness of breath at times with exertion.  No lower extremity edema, headaches or dizziness.  SVT/VT: She is not had any firing of her defibrillator.  Endorses palpitations for the past 3 nights longest episode lasting about 4 minutes.  Elevated LFTs: Patient states that prior to hospitalization she was drinking about 2-3 times a week.  When she drank she would have two 12 oz beers with 3 shots of vodka.  HM: Due for flu and Tdap vaccines.  She will also need a Pap in the future.   Outpatient Encounter Medications as of 03/05/2021  Medication Sig   calcium carbonate (TUMS - DOSED IN MG ELEMENTAL CALCIUM) 500 MG chewable tablet Chew 1 tablet (200 mg of elemental calcium total) by mouth 2 (two) times daily.   levETIRAcetam (KEPPRA) 500 MG tablet Take 1 tablet (500 mg total) by mouth 2 (two) times daily.   metoprolol tartrate (LOPRESSOR) 25 MG tablet Take 1 tablet (25 mg total) by mouth 2 (two) times daily.   potassium chloride SA (KLOR-CON) 20 MEQ tablet Take 1 tablet (20 mEq total) by mouth daily for 7 days. (Patient not taking: Reported on 02/03/2021)   No facility-administered encounter medications on file as of 03/05/2021.      Observations/Objective:   Chemistry      Component Value Date/Time   NA 130 (L) 02/03/2021 1623   K 4.0 02/03/2021 1623   CL 99 02/03/2021 1623   CO2 14 (L) 02/03/2021 1623   BUN 16 02/03/2021 1623   CREATININE 1.24 (  H) 02/03/2021 1623      Component Value Date/Time   CALCIUM 9.7 02/03/2021 1623   ALKPHOS 89 02/03/2021 1623   AST 121 (H) 02/03/2021 1623   ALT 90 (H) 02/03/2021 1623   BILITOT 2.3 (H) 02/03/2021 1623     Lab Results  Component Value Date   WBC 8.9 02/03/2021   HGB 13.3 02/03/2021   HCT 42.4 02/03/2021   MCV 83.6 02/03/2021   PLT 324 02/03/2021     Assessment and Plan: 1. Encounter to establish care  2. Seizure disorder [facility]) Patient to continue Keppra. Advised to avoid working at heights, going swimming without  someone else being present and avoid taking baths in a bathtub.  Reintegrated that she should not drive until she has been seizure-free for 6 to 12 months. -Once she gets Medicaid, will refer to neurology.  While she is awaiting that, I told her how to apply for the orange card/cone discount card - levETIRAcetam (KEPPRA) 500 MG tablet; Take 1 tablet (500 mg total) by mouth 2 (two) times daily.  Dispense: 180 tablet; Refill: 0  3. Essential hypertension DASH diet encouraged. - metoprolol tartrate (LOPRESSOR) 25 MG tablet; Take 1 tablet (25 mg total) by mouth 2 (two) times daily.  Dispense: 60 tablet; Refill: 2  4. Paroxysmal SVT (supraventricular tachycardia) (HCC) Will check TSH level given recent palpitations.  We will also refer her to cardiology.  5. Abnormal LFTs Went over with her how much is too much alcohol in 1 setting for a female.  Advised not to drink more than one 12 ounce beer a day. - Hepatitis B surface antigen; Future - HCV Ab w Reflex to Quant PCR; Future - Comprehensive metabolic panel; Future  6. Alcohol consumption binge drinking See #5 above  7. Hyponatremia Check chemistry again.  8. Need for influenza vaccination Patient told of how she can get flu shot for free this coming Saturday at our mobile [facility] which will be parked outside of our building.   Follow Up Instructions: 6wks   I discussed the assessment and treatment plan with the patient. The patient was provided an opportunity to ask questions and all were answered. The patient agreed with the plan and demonstrated an understanding of the instructions.   The patient was advised to call back or seek an in-person evaluation if the symptoms worsen or if the condition fails to improve as anticipated.  I spent 23 minutes dedicated to the care of this patient on the date of this encounter to include previsit review of of her chart, face-to-face time with the patient and post visit entering of  orders.    Karle Plumber, MD

## 2021-03-06 ENCOUNTER — Telehealth: Payer: Self-pay

## 2021-03-06 NOTE — Telephone Encounter (Signed)
Pt has been scheduled and reminder has been mailed.  

## 2021-03-06 NOTE — Telephone Encounter (Signed)
-----   Message from Marcine Matar, MD sent at 03/05/2021  5:40 PM EST ----- Give follow-up appointment with me in 6 to 8 weeks.

## 2021-03-12 ENCOUNTER — Other Ambulatory Visit: Payer: Self-pay

## 2021-03-17 ENCOUNTER — Other Ambulatory Visit: Payer: Self-pay

## 2021-04-14 ENCOUNTER — Ambulatory Visit (INDEPENDENT_AMBULATORY_CARE_PROVIDER_SITE_OTHER): Payer: Medicaid - Out of State

## 2021-04-14 DIAGNOSIS — I471 Supraventricular tachycardia: Secondary | ICD-10-CM | POA: Diagnosis not present

## 2021-04-14 LAB — CUP PACEART REMOTE DEVICE CHECK
Battery Remaining Longevity: 125 mo
Battery Voltage: 3.07 V
Brady Statistic AP VP Percent: 0.01 %
Brady Statistic AP VS Percent: 16.73 %
Brady Statistic AS VP Percent: 0.03 %
Brady Statistic AS VS Percent: 83.23 %
Brady Statistic RA Percent Paced: 16.71 %
Brady Statistic RV Percent Paced: 0.04 %
Date Time Interrogation Session: 20230221022602
HighPow Impedance: 64 Ohm
Implantable Lead Implant Date: 20221121
Implantable Lead Implant Date: 20221121
Implantable Lead Location: 753859
Implantable Lead Location: 753860
Implantable Lead Model: 5076
Implantable Pulse Generator Implant Date: 20221121
Lead Channel Impedance Value: 380 Ohm
Lead Channel Impedance Value: 456 Ohm
Lead Channel Impedance Value: 494 Ohm
Lead Channel Pacing Threshold Amplitude: 0.5 V
Lead Channel Pacing Threshold Amplitude: 0.625 V
Lead Channel Pacing Threshold Pulse Width: 0.4 ms
Lead Channel Pacing Threshold Pulse Width: 0.4 ms
Lead Channel Sensing Intrinsic Amplitude: 1.375 mV
Lead Channel Sensing Intrinsic Amplitude: 1.375 mV
Lead Channel Sensing Intrinsic Amplitude: 11.5 mV
Lead Channel Sensing Intrinsic Amplitude: 11.5 mV
Lead Channel Setting Pacing Amplitude: 3.25 V
Lead Channel Setting Pacing Amplitude: 3.25 V
Lead Channel Setting Pacing Pulse Width: 0.4 ms
Lead Channel Setting Sensing Sensitivity: 0.3 mV

## 2021-04-15 ENCOUNTER — Other Ambulatory Visit: Payer: Self-pay

## 2021-04-20 ENCOUNTER — Other Ambulatory Visit: Payer: Self-pay

## 2021-04-21 NOTE — Progress Notes (Signed)
Remote ICD transmission.   

## 2021-04-30 ENCOUNTER — Encounter: Payer: Medicaid - Out of State | Admitting: Cardiology

## 2021-04-30 NOTE — Progress Notes (Deleted)
? ?Electrophysiology Office Note ? ? ?Date:  04/30/2021  ? ?ID:  Sharon Hanson, DOB [birth year], MRN [id number] ? ?PCP:  Pcp, No  ?Cardiologist:   ?Primary Electrophysiologist:  Sharon Lingerfelt Jorja Loa, MD   ? ?Chief Complaint: wide complex tachycardia ?  ?History of Present Illness: ?Sharon Hanson is a 41 y.o. female who is being seen today for the evaluation of WCT at the request of Marcine Matar, MD. Presenting today for electrophysiology evaluation. ? ?Has a history significant for hypertension, obesity.  She presented to the hospital 01/08/2021 after having a seizure while at work.  She had a second tonic-clonic seizure in the CT scanner.  She had no fecal or urinary incontinence.  She was sedated post procedure with poor memory of what occurred.  After her CT scan seizure, she was noted to have a heart rate of 240 bpm and was noted to have a wide-complex tachycardia.  On 01/12/2021 she had an EP study that was negative for arrhythmia.  She had a Medtronic dual-chamber ICD implanted 01/12/2021. ? ?Today, she denies*** symptoms of palpitations, chest pain, shortness of breath, orthopnea, PND, lower extremity edema, claudication, dizziness, presyncope, syncope, bleeding, or neurologic sequela. The patient is tolerating medications without difficulties.  ? ? ?Past Medical History:  ?Diagnosis Date  ? Hypertension   ? ?Past Surgical History:  ?Procedure Laterality Date  ? ANKLE SURGERY Right   ? ELECTROPHYSIOLOGY STUDY N/A 01/12/2021  ? Procedure: ELECTROPHYSIOLOGY STUDY;  Surgeon: Regan Lemming, MD;  Location: [facility] INVASIVE CV LAB;  Service: Cardiovascular;  Laterality: N/A;  ? ICD IMPLANT N/A 01/12/2021  ? Procedure: ICD IMPLANT;  Surgeon: Regan Lemming, MD;  Location: [facility] INVASIVE CV LAB;  Service: Cardiovascular;  Laterality: N/A;  ? ? ? ?Current Outpatient Medications  ?Medication Sig Dispense Refill  ? levETIRAcetam (KEPPRA) 500 MG tablet Take 1 tablet (500 mg total) by mouth 2 (two) times  daily. 180 tablet 0  ? metoprolol tartrate (LOPRESSOR) 25 MG tablet Take 1 tablet (25 mg total) by mouth 2 (two) times daily. 60 tablet 2  ? ?No current facility-administered medications for this visit.  ? ? ?Allergies:   Pineapple  ? ?Social History:  The patient  reports that she has quit smoking. Her smoking use included cigars. She has never used smokeless tobacco. She reports current alcohol use. She reports that she does not use drugs.  ? ?Family History:  The patient's family history includes Hypertension in an other family member.  ? ? ?ROS:  Please see the history of present illness.   Otherwise, review of systems is positive for none.   All other systems are reviewed and negative.  ? ? ?PHYSICAL EXAM: ?VS:  There were no vitals taken for this visit. , BMI There is no height or weight on file to calculate BMI. ?GEN: Well nourished, well developed, in no acute distress  ?HEENT: normal  ?Neck: no JVD, carotid bruits, or masses ?Cardiac: ***RRR; no murmurs, rubs, or gallops,no edema  ?Respiratory:  clear to auscultation bilaterally, normal work of breathing ?GI: soft, nontender, nondistended, + BS ?MS: no deformity or atrophy  ?Skin: warm and dry, device pocket is well healed ?Neuro:  Strength and sensation are intact ?Psych: euthymic mood, full affect ? ?EKG:  EKG {ACTION; IS/IS [id number]:[id number]} ordered today. ?Personal review of the ekg ordered *** shows *** ? ?Device interrogation is reviewed today in detail.  See PaceArt for details. ? ? ?Recent Labs: ?01/11/2021: TSH 4.922 ?02/03/2021: ALT 90; B Natriuretic Peptide  17.4; BUN 16; Creatinine, Ser 1.24; Hemoglobin 13.3; Magnesium 2.1; Platelets 324; Potassium 4.0; Sodium 130  ? ? ?Lipid Panel  ?No results found for: CHOL, TRIG, HDL, CHOLHDL, VLDL, LDLCALC, LDLDIRECT ? ? ?Wt Readings from Last 3 Encounters:  ?01/13/21 231 lb 6.4 oz (105 kg)  ?10/06/20 260 lb (117.9 kg)  ?  ? ? ?Other studies Reviewed: ?Additional studies/ records that were reviewed today  include: CMRI 01/12/21  ?Review of the above records today demonstrates:  ?1.  Normal LV size and function EF 53% ?  ?2.  Normal RV size and function no dysplasia ?  ?3.  Normal parametric measures ?  ?4.  No delayed gadolinium uptake ?  ?5.  Trivial LV posterior pericardial effusion ?  ?6.  Normal cardiac valves ?  ?7.  Normal ascending aortic root 3.0 cm ? ? ?ASSESSMENT AND PLAN: ? ?1.  Ventricular tachycardia: Status post Medtronic dual-chamber ICD implanted 01/12/2021.  Currently on metoprolol 25 mg twice daily.  *** ? ? ? ?Current medicines are reviewed at length with the patient today.   ?The patient {ACTIONS; HAS/DOES NOT HAVE:19233} concerns regarding her medicines.  The following changes were made today:  {NONE DEFAULTED:18576} ? ?Labs/ tests ordered today include: *** ?No orders of the defined types were placed in this encounter. ? ? ? ?Disposition:   FU with Elizeth Weinrich {gen number [id number] {Days to years:10300} ? ?Signed, ?Wyman Meschke Meredith Leeds, MD  ?04/30/2021 2:23 PM    ? ?[facility] ?[address] ?[address] ?[place] [address] ?[phone] (office) ?[phone] (fax) ? ?

## 2021-05-04 ENCOUNTER — Ambulatory Visit: Payer: Medicaid - Out of State | Admitting: Internal Medicine

## 2021-05-18 ENCOUNTER — Other Ambulatory Visit: Payer: Self-pay

## 2021-05-19 ENCOUNTER — Other Ambulatory Visit: Payer: Self-pay

## 2021-07-14 ENCOUNTER — Ambulatory Visit (INDEPENDENT_AMBULATORY_CARE_PROVIDER_SITE_OTHER): Payer: Self-pay

## 2021-07-14 DIAGNOSIS — I471 Supraventricular tachycardia: Secondary | ICD-10-CM

## 2021-07-15 LAB — CUP PACEART REMOTE DEVICE CHECK
Battery Remaining Longevity: 126 mo
Battery Voltage: 3.03 V
Brady Statistic AP VP Percent: 0.01 %
Brady Statistic AP VS Percent: 9.43 %
Brady Statistic AS VP Percent: 0.03 %
Brady Statistic AS VS Percent: 90.53 %
Brady Statistic RA Percent Paced: 9.44 %
Brady Statistic RV Percent Paced: 0.04 %
Date Time Interrogation Session: 20230523042405
HighPow Impedance: 59 Ohm
Implantable Lead Implant Date: 20221121
Implantable Lead Implant Date: 20221121
Implantable Lead Location: 753859
Implantable Lead Location: 753860
Implantable Lead Model: 5076
Implantable Pulse Generator Implant Date: 20221121
Lead Channel Impedance Value: 323 Ohm
Lead Channel Impedance Value: 380 Ohm
Lead Channel Impedance Value: 456 Ohm
Lead Channel Pacing Threshold Amplitude: 0.5 V
Lead Channel Pacing Threshold Amplitude: 0.625 V
Lead Channel Pacing Threshold Pulse Width: 0.4 ms
Lead Channel Pacing Threshold Pulse Width: 0.4 ms
Lead Channel Sensing Intrinsic Amplitude: 11.75 mV
Lead Channel Sensing Intrinsic Amplitude: 11.75 mV
Lead Channel Sensing Intrinsic Amplitude: 2.375 mV
Lead Channel Sensing Intrinsic Amplitude: 2.375 mV
Lead Channel Setting Pacing Amplitude: 1 V
Lead Channel Setting Pacing Amplitude: 1.5 V
Lead Channel Setting Pacing Pulse Width: 0.4 ms
Lead Channel Setting Sensing Sensitivity: 0.3 mV

## 2021-07-29 ENCOUNTER — Other Ambulatory Visit: Payer: Self-pay | Admitting: *Deleted

## 2021-07-29 ENCOUNTER — Telehealth: Payer: Self-pay

## 2021-07-29 ENCOUNTER — Telehealth: Payer: Self-pay | Admitting: Internal Medicine

## 2021-07-29 DIAGNOSIS — I10 Essential (primary) hypertension: Secondary | ICD-10-CM

## 2021-07-29 NOTE — Telephone Encounter (Signed)
I spoke with patient and she will wait until her appt for medications

## 2021-07-29 NOTE — Progress Notes (Signed)
Remote ICD transmission.   

## 2021-07-29 NOTE — Telephone Encounter (Signed)
Pt requested medication refill. Please ontact with an update.

## 2021-08-02 ENCOUNTER — Emergency Department (HOSPITAL_COMMUNITY)
Admission: EM | Admit: 2021-08-02 | Discharge: 2021-08-02 | Disposition: A | Discharge status: Home / Self Care | Payer: Medicaid - Out of State | Attending: Emergency Medicine | Admitting: Emergency Medicine

## 2021-08-02 ENCOUNTER — Other Ambulatory Visit: Payer: Self-pay

## 2021-08-02 DIAGNOSIS — R569 Unspecified convulsions: Secondary | ICD-10-CM | POA: Diagnosis present

## 2021-08-02 DIAGNOSIS — G40909 Epilepsy, unspecified, not intractable, without status epilepticus: Secondary | ICD-10-CM

## 2021-08-02 DIAGNOSIS — I10 Essential (primary) hypertension: Secondary | ICD-10-CM | POA: Diagnosis not present

## 2021-08-02 DIAGNOSIS — Z79899 Other long term (current) drug therapy: Secondary | ICD-10-CM | POA: Diagnosis not present

## 2021-08-02 LAB — BASIC METABOLIC PANEL
Anion gap: 12 (ref 5–15)
BUN: 20 mg/dL (ref 6–20)
CO2: 20 mmol/L — ABNORMAL LOW (ref 22–32)
Calcium: 8.6 mg/dL — ABNORMAL LOW (ref 8.9–10.3)
Chloride: 103 mmol/L (ref 98–111)
Creatinine, Ser: 1.01 mg/dL — ABNORMAL HIGH (ref 0.44–1.00)
GFR, Estimated: >60 mL/min (ref >60)
Glucose, Bld: 101 mg/dL — ABNORMAL HIGH (ref 70–99)
Potassium: 3.9 mmol/L (ref 3.5–5.1)
Sodium: 135 mmol/L (ref 135–145)

## 2021-08-02 LAB — I-STAT BETA HCG BLOOD, ED (MC, WL, AP ONLY): I-stat hCG, quantitative: <5 m[IU]/mL (ref <5)

## 2021-08-02 MED ORDER — LEVETIRACETAM IN NACL 1500 MG/100ML IV SOLN
1500.0000 mg | Freq: Once | INTRAVENOUS | Status: AC
  Administered 2021-08-02: 1500 mg via INTRAVENOUS
  Filled 2021-08-02: qty 100

## 2021-08-02 MED ORDER — METOPROLOL TARTRATE 25 MG PO TABS
25.0000 mg | ORAL_TABLET | Freq: Once | ORAL | Status: AC
  Administered 2021-08-02: 25 mg via ORAL
  Filled 2021-08-02: qty 1

## 2021-08-02 MED ORDER — METOPROLOL TARTRATE 25 MG PO TABS
25.0000 mg | ORAL_TABLET | Freq: Two times a day (BID) | ORAL | 2 refills | Status: DC
  Filled 2021-08-02: qty 60, 30d supply, fill #0

## 2021-08-02 MED ORDER — LEVETIRACETAM 500 MG PO TABS
500.0000 mg | ORAL_TABLET | Freq: Two times a day (BID) | ORAL | 0 refills | Status: DC
  Filled 2021-08-02: qty 60, 30d supply, fill #0
  Filled 2021-09-03: qty 60, 30d supply, fill #1
  Filled 2021-10-15: qty 60, 30d supply, fill #2

## 2021-08-02 NOTE — ED Triage Notes (Signed)
Pt BIBA from home. Pt experienced a witnessed seizure lasting approx 30 sec-1 min. Pt had bad taste in mouth preceding seizure, which is typical for them. Pt was assisted to ground by girlfriend. Pt has small puncture mark on right side of tongue.  They have been out of their keppra and metoprolol for several days.  Aox4  BP: 178/86 HR: 100 SPO2: 95 CBG: 117

## 2021-08-02 NOTE — ED Provider Notes (Signed)
[facility]-EMERGENCY DEPT Provider Note   CSN: [id number] Arrival date & time: 08/02/21  1721     History {Add pertinent medical, surgical, social history, OB history to HPI:1} Chief Complaint  Patient presents with   Seizures    Sharon Hanson is a 41 y.o. female.  She has a history of seizure disorder and wide-complex tachycardia.  Today while at work she experienced an aura like she is going to get a seizure.  She went home with her partner and there had a 2-minute tonic-clonic seizure associated with tongue biting.  She said she feels a little sore on the left side of her body now.  Otherwise back to baseline.  Her last seizure was in December when at that time she had wide-complex tachycardia not thought to be V. tach.  Underwent an AICD.  She has been doing well on Keppra but has been out of it for the last few days.  Also has not taken her metoprolol recently.  The history is provided by the patient and a friend.  Seizures Seizure activity on arrival: no   Seizure type:  Grand mal Preceding symptoms: aura   Episode characteristics: stiffening and tongue biting   Return to baseline: yes   Duration:  2 minutes Timing:  Once Number of seizures this episode:  1 Progression:  Resolved Context: medical non-compliance   Recent head injury:  No recent head injuries PTA treatment:  None History of seizures: yes        Home Medications Prior to Admission medications   Medication Sig Start Date End Date Taking? Authorizing Provider  levETIRAcetam (KEPPRA) 500 MG tablet Take 1 tablet (500 mg total) by mouth 2 (two) times daily. 03/05/21   Marcine Matar, MD  metoprolol tartrate (LOPRESSOR) 25 MG tablet Take 1 tablet (25 mg total) by mouth 2 (two) times daily. 03/05/21   Marcine Matar, MD      Allergies    Pineapple    Review of Systems   Review of Systems  Constitutional:  Negative for fever.  Respiratory:  Negative for shortness of breath.    Cardiovascular:  Negative for chest pain.  Gastrointestinal:  Negative for abdominal pain.  Neurological:  Positive for seizures.    Physical Exam Updated Vital Signs BP (!) 176/137   Pulse 88   Temp 97.9 F (36.6 C) (Oral)   Resp (!) 23   Ht 5\' 7"  (1.702 m)   Wt 102.1 kg   SpO2 98%   BMI 35.24 kg/m  Physical Exam Vitals and nursing note reviewed.  Constitutional:      General: She is not in acute distress.    Appearance: Normal appearance. She is well-developed.  HENT:     Head: Normocephalic and atraumatic.     Mouth/Throat:     Comments: She has a little bit of ecchymosis on her lateral right tongue border Eyes:     Conjunctiva/sclera: Conjunctivae normal.  Cardiovascular:     Rate and Rhythm: Normal rate and regular rhythm.     Heart sounds: No murmur heard. Pulmonary:     Effort: Pulmonary effort is normal. No respiratory distress.     Breath sounds: Normal breath sounds.  Abdominal:     Palpations: Abdomen is soft.     Tenderness: There is no abdominal tenderness.  Musculoskeletal:        General: No deformity. Normal range of motion.     Cervical back: Neck supple.  Skin:  General: Skin is warm and dry.     Capillary Refill: Capillary refill takes less than 2 seconds.  Neurological:     General: No focal deficit present.     Mental Status: She is alert.     ED Results / Procedures / Treatments   Labs (all labs ordered are listed, but only abnormal results are displayed) Labs Reviewed  BASIC METABOLIC PANEL  CBG MONITORING, ED  I-STAT BETA HCG BLOOD, ED ([facility], [facility], [facility] ONLY)    EKG None  Radiology No results found.  Procedures Procedures  {Document cardiac monitor, telemetry assessment procedure when appropriate:1}  Medications Ordered in ED Medications  levETIRAcetam (KEPPRA) IVPB 1500 mg/ 100 mL premix (has no administration in time range)  metoprolol tartrate (LOPRESSOR) tablet 25 mg (has no administration in time range)    ED Course/  Medical Decision Making/ A&P                           Medical Decision Making Amount and/or Complexity of Data Reviewed Labs: ordered.  Risk Prescription drug management.  This patient complains of ***; this involves an extensive number of treatment Options and is a complaint that carries with it a high risk of complications and morbidity. The differential includes ***  I ordered, reviewed and interpreted labs, which included *** I ordered medication *** and reviewed PMP when indicated. I ordered imaging studies which included *** and I independently    visualized and interpreted imaging which showed *** Additional history obtained from *** Previous records obtained and reviewed *** I consulted *** and discussed lab and imaging findings and discussed disposition.  Cardiac monitoring reviewed, *** Social determinants considered, *** Critical Interventions: ***  After the interventions stated above, I reevaluated the patient and found *** Admission and further testing considered, ***    {Document critical care time when appropriate:1} {Document review of labs and clinical decision tools ie heart score, Chads2Vasc2 etc:1}  {Document your independent review of radiology images, and any outside records:1} {Document your discussion with family members, caretakers, and with consultants:1} {Document social determinants of health affecting pt's care:1} {Document your decision making why or why not admission, treatments were needed:1} Final Clinical Impression(s) / ED Diagnoses Final diagnoses:  None    Rx / DC Orders ED Discharge Orders     None

## 2021-08-03 ENCOUNTER — Other Ambulatory Visit: Payer: Self-pay

## 2021-08-12 ENCOUNTER — Telehealth: Payer: Self-pay

## 2021-08-12 NOTE — Telephone Encounter (Signed)
Spoke with patient informed him that he needed to be brought in to check his device, patient is overdue for [facility] also need to make adjustments to patients ICD per Medtronic see email form E. Sharon Hanson, patient stated that he could only come on Tuesdays after 1pm apt scheduled with Francis Dowse August 18 2021 at 2:45pm

## 2021-08-16 NOTE — Progress Notes (Signed)
Cardiology Office Note Date:  08/16/2021  Patient ID:  Sharon, Hanson 1981/01/24, MRN [id number] PCP:  Marcine Matar, MD  Electrophysiologist: Dr. Elberta Hanson    Chief Complaint: device recall  History of Present Illness: Sharon Hanson is a 41 y.o. female with history of HTN, obesity, seizure d/o, VT  Hospitalized for seizures 01/08/22, witness tonic clonic seizure with tongue biting and post ictal state, in the ER/CT scanner  well as episodes of VT Loaded on keppra and amiodarone EP consulted C.MRI was without LGE or other findings to explain her VT, EPS was negative as well Was decide to pursue ICD for secondary prevention Discharged 01/13/21, no AAD  02/03/21, ER visit with LLQ pain, ? CP, SOB, found with an acute cystitis, dehydrated, d/c on abx  08/02/21: ER visit with seizure, proceeded by aura.  Out of her keppra and metoprolol a few days Given IF keppra and lopressor and refills sent for her.  She did come to her wound check visit but no-showed to Dr. Elberta Hanson visit  MDT recall has come up requiring programming changes to shock polarities  + remotes  TODAY She is doing well No device concerns. 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 No CP, palpitations or cardiac awareness.  She has a couple months of refills now for both meds, no further seizures back on her keppra  BPs at home are better then here   Device information MDT dual chamber ICD implanted 01/12/2021   Past Medical History:  Diagnosis Date   Hypertension     Past Surgical History:  Procedure Laterality Date   ANKLE SURGERY Right    ELECTROPHYSIOLOGY STUDY N/A 01/12/2021   Procedure: ELECTROPHYSIOLOGY STUDY;  Surgeon: Regan Lemming, MD;  Location: [facility] INVASIVE CV LAB;  Service: Cardiovascular;  Laterality: N/A;   ICD IMPLANT N/A 01/12/2021   Procedure: ICD IMPLANT;  Surgeon: Regan Lemming, MD;  Location: [facility] INVASIVE CV LAB;  Service: Cardiovascular;  Laterality: N/A;    Current Outpatient Medications  Medication Sig Dispense Refill   levETIRAcetam (KEPPRA) 500 MG tablet Take 1 tablet (500 mg total) by mouth 2 (two) times daily. 180 tablet 0   metoprolol tartrate (LOPRESSOR) 25 MG tablet Take 1 tablet (25 mg total) by mouth 2 (two) times daily. 60 tablet 2   No current facility-administered medications for this visit.    Allergies:   Pineapple   Social History:  The patient  reports that she has quit smoking. Her smoking use included cigars. She has never used smokeless tobacco. She reports current alcohol use. She reports that she does not use drugs.   Family History:  The patient's family history includes Hypertension in an other family member.  ROS:  Please see the history of present illness.    All other systems are reviewed and otherwise negative.   PHYSICAL EXAM:  VS:  There were no vitals taken for this visit. BMI: There is no height or weight on file to calculate BMI. Well nourished, well developed, in no acute distress HEENT: normocephalic, atraumatic Neck: no JVD, carotid bruits or masses Cardiac:  RRR; no significant murmurs, no rubs, or gallops Lungs:  CTA b/l, no wheezing, rhonchi or rales Abd: soft, nontender MS: no deformity or atrophy Ext: no edema Skin: warm and dry, no rash Neuro:  No gross deficits appreciated Psych: euthymic mood, full affect  ICD site is stable, no tethering or discomfort  EKG:  not done today  Device interrogation done today and reviewed by myself:  Battery and lead measurements are god No VT + SVT/ATach vs ST, most are only seconds in duration With industry guidance, all tachy therapy pathways were change to B.AX  01/12/21: EPS CONCLUSIONS:  1. Sinus rhythm upon presentation.  2.  Negative EP study   C.MRI: 01/12/21 IMPRESSION: 1.  Normal LV size and function EF 53% 2.  Normal RV size and function  no dysplasia 3.  Normal parametric measures 4.  No delayed gadolinium uptake 5.  Trivial LV posterior pericardial effusion 6.  Normal cardiac valves 7.  Normal ascending aortic root 3.0 cm   01/09/21: TTE  1. Left ventricular ejection fraction, by estimation, is 55 to 60%. The  left ventricle has normal function. The left ventricle has no regional  wall motion abnormalities. There is mild concentric left ventricular  hypertrophy. Left ventricular diastolic  parameters are consistent with Grade I diastolic dysfunction (impaired  relaxation).   2. Right ventricular systolic function is normal. The right ventricular  size is normal. There is normal pulmonary artery systolic pressure.   3. The mitral valve is normal in structure. Trivial mitral valve  regurgitation. No evidence of mitral stenosis.   4. The aortic valve is tricuspid. Aortic valve regurgitation is not  visualized. No aortic stenosis is present.   5. The inferior vena cava is normal in size with greater than 50%  respiratory variability, suggesting right atrial pressure of 3 mmHg.   Recent Labs: 01/11/2021: TSH 4.922 02/03/2021: ALT 90; B Natriuretic Peptide 17.4; Hemoglobin 13.3; Magnesium 2.1; Platelets 324 08/02/2021: BUN 20; Creatinine, Ser 1.01; Potassium 3.9; Sodium 135  No results found for requested labs within last 365 days.   Estimated Creatinine Clearance: 91.8 mL/min (A) (by C-G formula based on SCr of 1.01 mg/dL (H)).   Wt Readings from Last 3 Encounters:  08/02/21 225 lb (102.1 kg)  01/13/21 231 lb 6.4 oz (105 kg)  10/06/20 260 lb (117.9 kg)     Other studies reviewed: Additional studies/records reviewed today include: summarized above  ASSESSMENT AND PLAN:  ICD Device company recall Therapy pathways programmed as recommended by industry  Refills for her metoprolol ordered  HTN Better at home. Her partner is an MA and monitors at home  3. SVT Brief and asymptomatic Appriatetly binned by the  device    Disposition: F/u with [place] in a year, sooner if needed   Current medicines are reviewed at length with the patient today.  The patient did not have any concerns regarding medicines.  Norma Fredrickson, PA-C 08/16/2021 12:40 PM     [facility] [address] [place] [address] [phone] (office)  [phone] (fax)

## 2021-08-18 ENCOUNTER — Ambulatory Visit (INDEPENDENT_AMBULATORY_CARE_PROVIDER_SITE_OTHER): Payer: Self-pay | Admitting: Physician Assistant

## 2021-08-18 ENCOUNTER — Encounter: Payer: Self-pay | Admitting: Physician Assistant

## 2021-08-18 ENCOUNTER — Other Ambulatory Visit: Payer: Self-pay

## 2021-08-18 VITALS — BP 140/98 | HR 68 | Ht 67.0 in | Wt 211.0 lb

## 2021-08-18 DIAGNOSIS — I472 Ventricular tachycardia, unspecified: Secondary | ICD-10-CM

## 2021-08-18 DIAGNOSIS — I471 Supraventricular tachycardia: Secondary | ICD-10-CM

## 2021-08-18 DIAGNOSIS — I10 Essential (primary) hypertension: Secondary | ICD-10-CM

## 2021-08-18 LAB — CUP PACEART INCLINIC DEVICE CHECK
Battery Remaining Longevity: 125 mo
Battery Voltage: 3.03 V
Brady Statistic AP VP Percent: 0.01 %
Brady Statistic AP VS Percent: 10.99 %
Brady Statistic AS VP Percent: 0.03 %
Brady Statistic AS VS Percent: 88.97 %
Brady Statistic RA Percent Paced: 10.99 %
Brady Statistic RV Percent Paced: 0.04 %
Date Time Interrogation Session: 20230627174707
HighPow Impedance: 71 Ohm
Implantable Lead Implant Date: 20221121
Implantable Lead Implant Date: 20221121
Implantable Lead Location: 753859
Implantable Lead Location: 753860
Implantable Lead Model: 5076
Implantable Pulse Generator Implant Date: 20221121
Lead Channel Impedance Value: 380 Ohm
Lead Channel Impedance Value: 399 Ohm
Lead Channel Impedance Value: 456 Ohm
Lead Channel Pacing Threshold Amplitude: 0.5 V
Lead Channel Pacing Threshold Amplitude: 0.625 V
Lead Channel Pacing Threshold Pulse Width: 0.4 ms
Lead Channel Pacing Threshold Pulse Width: 0.4 ms
Lead Channel Sensing Intrinsic Amplitude: 1.5 mV
Lead Channel Sensing Intrinsic Amplitude: 12.25 mV
Lead Channel Sensing Intrinsic Amplitude: 13.625 mV
Lead Channel Sensing Intrinsic Amplitude: 2.25 mV
Lead Channel Setting Pacing Amplitude: 1 V
Lead Channel Setting Pacing Amplitude: 1.5 V
Lead Channel Setting Pacing Pulse Width: 0.4 ms
Lead Channel Setting Sensing Sensitivity: 0.3 mV

## 2021-08-18 MED ORDER — METOPROLOL TARTRATE 25 MG PO TABS
25.0000 mg | ORAL_TABLET | Freq: Two times a day (BID) | ORAL | 3 refills | Status: DC
  Filled 2021-08-18: qty 90, 45d supply, fill #0
  Filled 2021-09-03: qty 60, 30d supply, fill #0
  Filled 2021-10-15: qty 60, 30d supply, fill #1
  Filled 2021-11-25 – 2022-01-12 (×3): qty 60, 30d supply, fill #2
  Filled 2022-02-06: qty 60, 30d supply, fill #3
  Filled 2022-03-24: qty 60, 30d supply, fill #4

## 2021-08-26 ENCOUNTER — Ambulatory Visit: Payer: Medicaid Other | Admitting: Physician Assistant

## 2021-08-26 NOTE — Progress Notes (Deleted)
Patient ID: Sharon Hanson, female   DOB: [birth year], 41 y.o.   MRN: [id number]   After ED visit 08/02/2021 for seizure.  Seen by cardiology 08/02/2021  From ED notes: Sharon Hanson is a 41 y.o. female.  She has a history of seizure disorder and wide-complex tachycardia.  Today while at work she experienced an aura like she is going to get a seizure.  She went home with her partner and there had a 2-minute tonic-clonic seizure associated with tongue biting.  She said she feels a little sore on the left side of her body now.  Otherwise back to baseline.  Her last seizure was in December when at that time she had wide-complex tachycardia not thought to be V. tach.  Underwent an AICD.  She has been doing well on Keppra but has been out of it for the last few days.  Also has not taken her metoprolol recently.  From A/P: This patient complains of seizure; this involves an extensive number of treatment Options and is a complaint that carries with it a high risk of complications and morbidity. The differential includes seizure, medication noncompliance, metabolic derangement, arrhythmia   I ordered, reviewed and interpreted labs, which included CBC with fairly normal chemistry other than low bicarb consistent with recent seizure, pregnancy test negative I ordered medication IV Keppra and oral metoprolol and reviewed PMP when indicated.   Additional history obtained from patient's partner Previous records obtained and reviewed in epic including admission in December for seizure and tachycardia   Cardiac monitoring reviewed, normal sinus rhythm Social determinants considered, no significant barriers Critical Interventions: None   After the interventions stated above, I reevaluated the patient and found back to baseline no evidence of arrhythmia Admission and further testing considered, no indications for admission or further testing at this time.  Patient's blood pressure is high.  Recommended  continue her current medications and following up with her treatment team.  Return instructions discussed.  Medication refills provided  From cardiology note 08/18/2021 ASSESSMENT AND PLAN:   ICD Device company recall Therapy pathways programmed as recommended by industry   Refills for her metoprolol ordered   HTN Better at home. Her partner is an MA and monitors at home   3. SVT Brief and asymptomatic Appriatetly binned by the device

## 2021-09-03 ENCOUNTER — Other Ambulatory Visit: Payer: Self-pay

## 2021-09-04 ENCOUNTER — Other Ambulatory Visit: Payer: Self-pay

## 2021-09-22 ENCOUNTER — Ambulatory Visit: Payer: Medicaid - Out of State | Admitting: Internal Medicine

## 2021-10-13 ENCOUNTER — Ambulatory Visit (INDEPENDENT_AMBULATORY_CARE_PROVIDER_SITE_OTHER): Payer: Self-pay

## 2021-10-13 ENCOUNTER — Telehealth: Payer: Self-pay | Admitting: Licensed Clinical Social Worker

## 2021-10-13 DIAGNOSIS — I472 Ventricular tachycardia, unspecified: Secondary | ICD-10-CM

## 2021-10-13 NOTE — Progress Notes (Signed)
Heart and Vascular Care Navigation  10/13/2021  Mahrukh Seguin March 27, 1980 [id number]  Reason for Referral:  Patient is participating in a Managed Medicaid Plan: No, self pay. Medicaid Family Planning only  Engaged with patient by telephone for initial visit for Heart and Vascular Care Coordination.                                                                                                   Assessment:  LCSW was able to reach pt this morning at [phone]. Introduced self, role, reason for call. Pt shared that she is not insured at this time- her employer does offer insurance but she did not enroll in time therefore does not have any benefits. LCSW updated pt address to [address] [place], [place], [address]. Pt denies any cost of living concerns at this time, did have a recent hospital visit and clinic visit and therefore is interested in [employer] (is connected w/ [employer]). She currently utilizes [facility] pharmacy for medications denies any issues w/ obtaining or affording. LCSW will mail information and pt understands that she is able to f/u with me as needed to                                      HRT/VAS Care Coordination     Patients Home Cardiology Office [facility]   Outpatient Care Team Social Worker   Social Worker Name: Esmeralda Links [employer] [phone]   Living arrangements for the past 2 months Single Family Home   Lives with: Significant Other   Patient Has Concern With Paying Medical Bills Yes   Patient Concerns With Medical Bills no insurance   Medical Bill Referrals: [facility] Financial Assistance   Does Patient Have Prescription Coverage? No   Patient Prescription Assistance Programs --  is getting her medications at reduced costs at [facility] Assistive Devices/Equipment Eyeglasses       Social History:                                                                             SDOH Screenings    Alcohol Screen: Not on file  Depression (QMG5-0): Not on file  Financial Resource Strain: Medium Risk (10/13/2021)   Overall Financial Resource Strain (CARDIA)    Difficulty of Paying Living Expenses: Somewhat hard  Food Insecurity: No Food Insecurity (10/13/2021)   Hunger Vital Sign    Worried About Running Out of Food in the Last Year: Never true    Ran Out of Food in the Last Year: Never true  Housing: Low Risk  (10/13/2021)   Housing    Last Housing Risk Score: 0  Physical Activity: Not on file  Social Connections: Not on file  Stress: Not on file  Tobacco Use: Medium Risk (08/18/2021)   Patient History    Smoking Tobacco Use: Former    Smokeless Tobacco Use: Never    Passive Exposure: Not on file  Transportation Needs: No Transportation Needs (10/13/2021)   PRAPARE - Transportation    Lack of Transportation (Medical): No    Lack of Transportation (Non-Medical): No    SDOH Interventions: Financial Resources:  Financial Strain Interventions: Other (Comment) Network engineer Assistance; [employer]) Editor, commissioning for [employer] Program  Food Insecurity:  Food Insecurity Interventions: Intervention Not Indicated  Housing Insecurity:  Housing Interventions: Intervention Not Indicated  Transportation:   Transportation Interventions: Intervention Not Indicated     Other Care Navigation Interventions:     Provided Pharmacy assistance resources  (is getting her medications at reduced costs at [facility])   Follow-up plan:   LCSW has mailed pt the following: my card, [employer] and [employer]. I have started applications for pt, will f/u to ensure they were received and answer any additional questions.

## 2021-10-14 LAB — CUP PACEART REMOTE DEVICE CHECK
Battery Remaining Longevity: 123 mo
Battery Voltage: 3.02 V
Brady Statistic AP VP Percent: 0.02 %
Brady Statistic AP VS Percent: 25.15 %
Brady Statistic AS VP Percent: 0.02 %
Brady Statistic AS VS Percent: 74.81 %
Brady Statistic RA Percent Paced: 25.17 %
Brady Statistic RV Percent Paced: 0.03 %
Date Time Interrogation Session: 20230823102750
HighPow Impedance: 73 Ohm
Implantable Lead Implant Date: 20221121
Implantable Lead Implant Date: 20221121
Implantable Lead Location: 753859
Implantable Lead Location: 753860
Implantable Lead Model: 5076
Implantable Pulse Generator Implant Date: 20221121
Lead Channel Impedance Value: 380 Ohm
Lead Channel Impedance Value: 399 Ohm
Lead Channel Impedance Value: 494 Ohm
Lead Channel Pacing Threshold Amplitude: 0.5 V
Lead Channel Pacing Threshold Amplitude: 0.625 V
Lead Channel Pacing Threshold Pulse Width: 0.4 ms
Lead Channel Pacing Threshold Pulse Width: 0.4 ms
Lead Channel Sensing Intrinsic Amplitude: 1.5 mV
Lead Channel Sensing Intrinsic Amplitude: 1.5 mV
Lead Channel Sensing Intrinsic Amplitude: 11.25 mV
Lead Channel Sensing Intrinsic Amplitude: 11.25 mV
Lead Channel Setting Pacing Amplitude: 1 V
Lead Channel Setting Pacing Amplitude: 1.5 V
Lead Channel Setting Pacing Pulse Width: 0.4 ms
Lead Channel Setting Sensing Sensitivity: 0.3 mV

## 2021-10-15 ENCOUNTER — Other Ambulatory Visit: Payer: Self-pay

## 2021-10-15 ENCOUNTER — Other Ambulatory Visit (HOSPITAL_COMMUNITY): Payer: Self-pay

## 2021-10-16 ENCOUNTER — Other Ambulatory Visit (HOSPITAL_COMMUNITY): Payer: Self-pay

## 2021-11-10 NOTE — Progress Notes (Signed)
Remote ICD transmission.   

## 2021-11-23 ENCOUNTER — Encounter (HOSPITAL_BASED_OUTPATIENT_CLINIC_OR_DEPARTMENT_OTHER): Payer: Self-pay

## 2021-11-23 ENCOUNTER — Emergency Department (HOSPITAL_BASED_OUTPATIENT_CLINIC_OR_DEPARTMENT_OTHER): Payer: Medicaid Other

## 2021-11-23 ENCOUNTER — Emergency Department (HOSPITAL_BASED_OUTPATIENT_CLINIC_OR_DEPARTMENT_OTHER)
Admission: EM | Admit: 2021-11-23 | Discharge: 2021-11-23 | Disposition: A | Discharge status: Home / Self Care | Payer: Medicaid Other | Attending: Emergency Medicine | Admitting: Emergency Medicine

## 2021-11-23 ENCOUNTER — Other Ambulatory Visit: Payer: Self-pay

## 2021-11-23 DIAGNOSIS — S20212A Contusion of left front wall of thorax, initial encounter: Secondary | ICD-10-CM | POA: Insufficient documentation

## 2021-11-23 DIAGNOSIS — Y990 Civilian activity done for income or pay: Secondary | ICD-10-CM | POA: Insufficient documentation

## 2021-11-23 DIAGNOSIS — X58XXXA Exposure to other specified factors, initial encounter: Secondary | ICD-10-CM | POA: Insufficient documentation

## 2021-11-23 DIAGNOSIS — Z79899 Other long term (current) drug therapy: Secondary | ICD-10-CM | POA: Insufficient documentation

## 2021-11-23 LAB — BASIC METABOLIC PANEL
Anion gap: 12 (ref 5–15)
BUN: 15 mg/dL (ref 6–20)
CO2: 24 mmol/L (ref 22–32)
Calcium: 8.5 mg/dL — ABNORMAL LOW (ref 8.9–10.3)
Chloride: 104 mmol/L (ref 98–111)
Creatinine, Ser: 0.99 mg/dL (ref 0.44–1.00)
GFR, Estimated: >60 mL/min (ref >60)
Glucose, Bld: 110 mg/dL — ABNORMAL HIGH (ref 70–99)
Potassium: 3.6 mmol/L (ref 3.5–5.1)
Sodium: 140 mmol/L (ref 135–145)

## 2021-11-23 LAB — CBC WITH DIFFERENTIAL/PLATELET
Abs Immature Granulocytes: 0.01 10*3/uL (ref 0.00–0.07)
Basophils Absolute: 0 10*3/uL (ref 0.0–0.1)
Basophils Relative: 1 %
Eosinophils Absolute: 0 10*3/uL (ref 0.0–0.5)
Eosinophils Relative: 1 %
HCT: 33.2 % — ABNORMAL LOW (ref 36.0–46.0)
Hemoglobin: 10.9 g/dL — ABNORMAL LOW (ref 12.0–15.0)
Immature Granulocytes: 0 %
Lymphocytes Relative: 35 %
Lymphs Abs: 1.7 10*3/uL (ref 0.7–4.0)
MCH: 30.9 pg (ref 26.0–34.0)
MCHC: 32.8 g/dL (ref 30.0–36.0)
MCV: 94.1 fL (ref 80.0–100.0)
Monocytes Absolute: 0.2 10*3/uL (ref 0.1–1.0)
Monocytes Relative: 5 %
Neutro Abs: 2.9 10*3/uL (ref 1.7–7.7)
Neutrophils Relative %: 58 %
Platelets: 280 10*3/uL (ref 150–400)
RBC: 3.53 MIL/uL — ABNORMAL LOW (ref 3.87–5.11)
RDW: 16.3 % — ABNORMAL HIGH (ref 11.5–15.5)
WBC: 4.9 10*3/uL (ref 4.0–10.5)
nRBC: 0 % (ref 0.0–0.2)

## 2021-11-23 LAB — TROPONIN I (HIGH SENSITIVITY): Troponin I (High Sensitivity): <2 ng/L (ref <18)

## 2021-11-23 MED ORDER — ONDANSETRON HCL 4 MG/2ML IJ SOLN
4.0000 mg | Freq: Once | INTRAMUSCULAR | Status: AC
  Administered 2021-11-23: 4 mg via INTRAVENOUS
  Filled 2021-11-23: qty 2

## 2021-11-23 MED ORDER — KETOROLAC TROMETHAMINE 30 MG/ML IJ SOLN
30.0000 mg | Freq: Once | INTRAMUSCULAR | Status: AC
  Administered 2021-11-23: 30 mg via INTRAVENOUS
  Filled 2021-11-23: qty 1

## 2021-11-23 NOTE — ED Triage Notes (Signed)
Pt arrived pov, complaining of chest pain in the upper left side of chest.  Does have a defibrillator in that side of her chest, hit it at work yesterday and has been hurting since.  Tonight she started with nausea and vomiting. Denies any shortness of breath or increased pain with movement

## 2021-11-23 NOTE — Discharge Instructions (Signed)
You were seen today for chest discomfort after an injury.  Your work-up is reassuring.  Take ibuprofen as needed for pain.

## 2021-11-23 NOTE — ED Provider Notes (Signed)
[facility] EM[facility]GENC[facility] DEPT Provider Note   CSN: [id number] Arrival date & time: 11/23/21  4742     History  Chief Complaint  Patient presents with   Chest Pain    Sharon Hanson is a 41 y.o. female.  HPI     This is a 41 year old female who presents with chest pain.  Patient reports that she was moving something at work yesterday when it fell back hitting her chest where her ICD is.  Since that time she has had persistent chest discomfort.  She has had some nausea.  No shortness of breath.  No recent illnesses.  Denies lower extremity swelling.  She has not noted that the defibrillator has fired.  Home Medications Prior to Admission medications   Medication Sig Start Date End Date Taking? Authorizing Provider  levETIRAcetam (KEPPRA) 500 MG tablet Take 1 tablet (500 mg total) by mouth 2 (two) times daily. 08/02/21   Hayden Rasmussen, MD  metoprolol tartrate (LOPRESSOR) 25 MG tablet Take 1 tablet (25 mg total) by mouth 2 (two) times daily. 08/18/21   Baldwin Jamaica, PA-C      Allergies    Pineapple    Review of Systems   Review of Systems  Cardiovascular:  Positive for chest pain.  All other systems reviewed and are negative.   Physical Exam Updated Vital Signs BP (!) 156/101   Pulse 64   Temp 98.2 F (36.8 C) (Oral)   Resp 18   Ht 1.702 m (5\' 7" )   Wt 97.5 kg   LMP 11/23/2021   SpO2 97%   BMI 33.67 kg/m  Physical Exam Vitals and nursing note reviewed.  Constitutional:      Appearance: She is well-developed.  HENT:     Head: Normocephalic and atraumatic.  Eyes:     Pupils: Pupils are equal, round, and reactive to light.  Cardiovascular:     Rate and Rhythm: Normal rate and regular rhythm.     Heart sounds: Normal heart sounds.     Comments: Defibrillator palpated left upper chest, no overlying hematoma or contusion noted, TTP Pulmonary:     Effort: Pulmonary effort is normal. No respiratory distress.     Breath sounds: No wheezing.   Abdominal:     General: Bowel sounds are normal.     Palpations: Abdomen is soft.  Musculoskeletal:     Cervical back: Neck supple.  Skin:    General: Skin is warm and dry.  Neurological:     Mental Status: She is alert and oriented to person, place, and time.  Psychiatric:        Mood and Affect: Mood normal.     ED Results / Procedures / Treatments   Labs (all labs ordered are listed, but only abnormal results are displayed) Labs Reviewed  CBC WITH DIFFERENTIAL/PLATELET - Abnormal; Notable for the following components:      Result Value   RBC 3.53 (*)    Hemoglobin 10.9 (*)    HCT 33.2 (*)    RDW 16.3 (*)    All other components within normal limits  BASIC METABOLIC PANEL - Abnormal; Notable for the following components:   Glucose, Bld 110 (*)    Calcium 8.5 (*)    All other components within normal limits  TROPONIN I (HIGH SENSITIVITY)    EKG EKG Interpretation  Date/Time:  Monday November 23 2021 05:14:59 EDT Ventricular Rate:  60 PR Interval:  127 QRS Duration: 99 QT Interval:  465 QTC Calculation:  465 R Axis:   79 Text Interpretation: Sinus rhythm Nonspecific repol abnormality, diffuse leads T wave inversions new Confirmed by Ross Marcus ([id number]) on 11/23/2021 5:37:51 AM  Radiology DG Chest Portable 1 View  Result Date: 11/23/2021 CLINICAL DATA:  Chest pain EXAM: PORTABLE CHEST 1 VIEW COMPARISON:  02/03/2021 FINDINGS: Stable heart size. Dual-chamber ICD/pacer from the left in stable and unremarkable position. There is no edema, consolidation, effusion, or pneumothorax. No osseous findings. IMPRESSION: Stable chest.  No evidence of active disease. Electronically Signed   By: Tiburcio Pea M.D.   On: 11/23/2021 06:03    Procedures Procedures    Medications Ordered in ED Medications  ondansetron (ZOFRAN) injection 4 mg (4 mg Intravenous Given 11/23/21 0615)  ketorolac (TORADOL) 30 MG/ML injection 30 mg (30 mg Intravenous Given 11/23/21 1497)    ED  Course/ Medical Decision Making/ A&P Clinical Course as of 11/23/21 0263  [facility] Nov 23, 2021  0620 Per [employer], pacemaker interrogation with functioning ICD.  No events. [CH]    Clinical Course User Index [CH] Caelynn Marshman, Mayer Masker, MD                           Medical Decision Making Amount and/or Complexity of Data Reviewed Labs: ordered. Radiology: ordered.  Risk Prescription drug management.   This patient presents to the ED for concern of chest pain, this involves an extensive number of treatment options, and is a complaint that carries with it a high risk of complications and morbidity.  I considered the following differential and admission for this acute, potentially life threatening condition.  The differential diagnosis includes trauma, contusion, hematoma, atypical ACS, less likely PE, pneumothorax, pneumonia  MDM:    This is a 41 year old female with a history of V. tach with a defibrillator who presents with pain.  Patient reports she took a direct hit to the chest right where her defibrillator was.  Since that time she has had pain.  She is also had some nausea.  She is nontoxic and vital signs are reassuring.  EKG shows no evidence of acute ischemia or arrhythmia.  Troponin x1 is negative.  Do not feel she needs a second troponin.  This is highly atypical.  Pacemaker interrogation without event and it appears to be working properly.  Labs are otherwise largely reassuring.  Patient chest x-ray without pneumothorax or pneumonia.  On recheck, she states she feels much better after Toradol.  Will discharge with ibuprofen.  (Labs, imaging, consults)  Labs: I Ordered, and personally interpreted labs.  The pertinent results include: CBC, BMP, troponin  Imaging Studies ordered: I ordered imaging studies including chest x-ray I independently visualized and interpreted imaging. I agree with the radiologist interpretation  Additional history obtained from family at bedside.   External records from outside source obtained and reviewed including prior evaluations  Cardiac Monitoring: The patient was maintained on a cardiac monitor.  I personally viewed and interpreted the cardiac monitored which showed an underlying rhythm of: Sinus rhythm  Reevaluation: After the interventions noted above, I reevaluated the patient and found that they have :improved  Social Determinants of Health: Lives independently  Disposition: Discharge  Co morbidities that complicate the patient evaluation  Past Medical History:  Diagnosis Date   Hypertension      Medicines Meds ordered this encounter  Medications   ondansetron (ZOFRAN) injection 4 mg   ketorolac (TORADOL) 30 MG/ML injection 30 mg    I have reviewed  the patients home medicines and have made adjustments as needed  Problem List / ED Course: Problem List Items Addressed This Visit   None Visit Diagnoses     Contusion of left chest wall, initial encounter    -  Primary                   Final Clinical Impression(s) / ED Diagnoses Final diagnoses:  Contusion of left chest wall, initial encounter    Rx / DC Orders ED Discharge Orders     None         Shon Baton, MD 11/23/21 [phone]

## 2021-11-25 ENCOUNTER — Other Ambulatory Visit: Payer: Self-pay | Admitting: Emergency Medicine

## 2021-11-25 ENCOUNTER — Other Ambulatory Visit: Payer: Self-pay

## 2021-11-25 DIAGNOSIS — G40909 Epilepsy, unspecified, not intractable, without status epilepticus: Secondary | ICD-10-CM

## 2021-12-01 ENCOUNTER — Other Ambulatory Visit: Payer: Self-pay

## 2022-01-12 ENCOUNTER — Other Ambulatory Visit (HOSPITAL_COMMUNITY): Payer: Self-pay

## 2022-01-12 ENCOUNTER — Other Ambulatory Visit (HOSPITAL_COMMUNITY): Payer: Self-pay | Admitting: Emergency Medicine

## 2022-01-12 ENCOUNTER — Ambulatory Visit (INDEPENDENT_AMBULATORY_CARE_PROVIDER_SITE_OTHER): Payer: Self-pay

## 2022-01-12 DIAGNOSIS — R55 Syncope and collapse: Secondary | ICD-10-CM

## 2022-01-12 DIAGNOSIS — G40909 Epilepsy, unspecified, not intractable, without status epilepticus: Secondary | ICD-10-CM

## 2022-01-12 LAB — CUP PACEART REMOTE DEVICE CHECK
Battery Remaining Longevity: 120 mo
Battery Voltage: 3.02 V
Brady Statistic AP VP Percent: 0.01 %
Brady Statistic AP VS Percent: 6.21 %
Brady Statistic AS VP Percent: 0.03 %
Brady Statistic AS VS Percent: 93.76 %
Brady Statistic RA Percent Paced: 6.21 %
Brady Statistic RV Percent Paced: 0.03 %
Date Time Interrogation Session: 20231121033624
HighPow Impedance: 65 Ohm
Implantable Lead Connection Status: 753985
Implantable Lead Connection Status: 753985
Implantable Lead Implant Date: 20221121
Implantable Lead Implant Date: 20221121
Implantable Lead Location: 753859
Implantable Lead Location: 753860
Implantable Lead Model: 5076
Implantable Pulse Generator Implant Date: 20221121
Lead Channel Impedance Value: 323 Ohm
Lead Channel Impedance Value: 399 Ohm
Lead Channel Impedance Value: 437 Ohm
Lead Channel Pacing Threshold Amplitude: 0.625 V
Lead Channel Pacing Threshold Amplitude: 0.625 V
Lead Channel Pacing Threshold Pulse Width: 0.4 ms
Lead Channel Pacing Threshold Pulse Width: 0.4 ms
Lead Channel Sensing Intrinsic Amplitude: 1.75 mV
Lead Channel Sensing Intrinsic Amplitude: 1.75 mV
Lead Channel Sensing Intrinsic Amplitude: 12.875 mV
Lead Channel Sensing Intrinsic Amplitude: 12.875 mV
Lead Channel Setting Pacing Amplitude: 1 V
Lead Channel Setting Pacing Amplitude: 1.5 V
Lead Channel Setting Pacing Pulse Width: 0.4 ms
Lead Channel Setting Sensing Sensitivity: 0.3 mV
Zone Setting Status: 755011
Zone Setting Status: 755011

## 2022-01-12 NOTE — Telephone Encounter (Signed)
Not patients primary care physician

## 2022-01-13 ENCOUNTER — Other Ambulatory Visit (HOSPITAL_COMMUNITY): Payer: Self-pay

## 2022-02-05 NOTE — Progress Notes (Signed)
Remote ICD transmission.   

## 2022-02-06 ENCOUNTER — Other Ambulatory Visit (HOSPITAL_COMMUNITY): Payer: Self-pay | Admitting: Emergency Medicine

## 2022-02-06 ENCOUNTER — Other Ambulatory Visit (HOSPITAL_COMMUNITY): Payer: Self-pay

## 2022-02-06 DIAGNOSIS — G40909 Epilepsy, unspecified, not intractable, without status epilepticus: Secondary | ICD-10-CM

## 2022-02-08 ENCOUNTER — Other Ambulatory Visit: Payer: Self-pay

## 2022-02-24 ENCOUNTER — Other Ambulatory Visit (HOSPITAL_COMMUNITY): Payer: Self-pay

## 2022-02-24 ENCOUNTER — Other Ambulatory Visit: Payer: Self-pay

## 2022-02-24 DIAGNOSIS — I10 Essential (primary) hypertension: Secondary | ICD-10-CM | POA: Diagnosis not present

## 2022-02-24 DIAGNOSIS — D649 Anemia, unspecified: Secondary | ICD-10-CM | POA: Diagnosis not present

## 2022-02-24 DIAGNOSIS — F419 Anxiety disorder, unspecified: Secondary | ICD-10-CM | POA: Diagnosis not present

## 2022-02-24 DIAGNOSIS — E7800 Pure hypercholesterolemia, unspecified: Secondary | ICD-10-CM | POA: Diagnosis not present

## 2022-02-24 DIAGNOSIS — R7303 Prediabetes: Secondary | ICD-10-CM | POA: Diagnosis not present

## 2022-02-24 DIAGNOSIS — G40909 Epilepsy, unspecified, not intractable, without status epilepticus: Secondary | ICD-10-CM | POA: Diagnosis not present

## 2022-02-24 MED ORDER — HYDROXYZINE HCL 25 MG PO TABS
12.5000 mg | ORAL_TABLET | Freq: Three times a day (TID) | ORAL | 3 refills | Status: DC | PRN
  Filled 2022-02-24: qty 30, 10d supply, fill #0
  Filled 2022-08-02: qty 30, 10d supply, fill #1
  Filled 2022-11-14 – 2023-01-04 (×3): qty 30, 10d supply, fill #2

## 2022-02-24 MED ORDER — FLUOXETINE HCL 40 MG PO CAPS
40.0000 mg | ORAL_CAPSULE | Freq: Every day | ORAL | 6 refills | Status: DC
  Filled 2022-02-24: qty 30, 30d supply, fill #0
  Filled 2022-03-24 – 2022-09-08 (×2): qty 30, 30d supply, fill #1
  Filled 2022-11-14: qty 30, 30d supply, fill #2

## 2022-02-24 MED ORDER — LEVETIRACETAM 500 MG PO TABS
500.0000 mg | ORAL_TABLET | Freq: Two times a day (BID) | ORAL | 3 refills | Status: DC
  Filled 2022-02-24: qty 60, 30d supply, fill #0
  Filled 2022-03-24: qty 60, 30d supply, fill #1

## 2022-02-24 MED ORDER — METOPROLOL SUCCINATE ER 50 MG PO TB24
50.0000 mg | ORAL_TABLET | Freq: Every day | ORAL | 3 refills | Status: DC
  Filled 2022-02-24: qty 90, 90d supply, fill #0
  Filled 2022-08-02: qty 30, 30d supply, fill #1
  Filled 2022-09-08: qty 30, 30d supply, fill #2
  Filled 2022-10-13: qty 30, 30d supply, fill #3
  Filled 2022-11-12: qty 30, 30d supply, fill #4
  Filled 2022-11-23: qty 30, 30d supply, fill #5

## 2022-03-02 ENCOUNTER — Other Ambulatory Visit (HOSPITAL_COMMUNITY): Payer: Self-pay

## 2022-03-02 DIAGNOSIS — I10 Essential (primary) hypertension: Secondary | ICD-10-CM | POA: Diagnosis not present

## 2022-03-02 DIAGNOSIS — E538 Deficiency of other specified B group vitamins: Secondary | ICD-10-CM | POA: Diagnosis not present

## 2022-03-02 DIAGNOSIS — D509 Iron deficiency anemia, unspecified: Secondary | ICD-10-CM | POA: Diagnosis not present

## 2022-03-02 MED ORDER — FOLIC ACID 1 MG PO TABS
1.0000 mg | ORAL_TABLET | Freq: Every day | ORAL | 3 refills | Status: AC
  Filled 2022-03-02 – 2022-08-02 (×3): qty 30, 30d supply, fill #0
  Filled 2022-09-08: qty 30, 30d supply, fill #1
  Filled 2022-11-14 – 2023-01-04 (×2): qty 30, 30d supply, fill #2

## 2022-03-02 MED ORDER — AMLODIPINE BESYLATE 5 MG PO TABS
5.0000 mg | ORAL_TABLET | Freq: Every day | ORAL | 3 refills | Status: DC
  Filled 2022-03-02 – 2022-03-24 (×2): qty 90, 90d supply, fill #0
  Filled 2022-08-02: qty 30, 30d supply, fill #0
  Filled 2022-09-08: qty 30, 30d supply, fill #1
  Filled 2022-10-13: qty 30, 30d supply, fill #2
  Filled 2022-11-14: qty 30, 30d supply, fill #3

## 2022-03-17 ENCOUNTER — Other Ambulatory Visit (HOSPITAL_COMMUNITY): Payer: Self-pay

## 2022-03-23 NOTE — Progress Notes (Unsigned)
Cardiology Office Note Date:  03/23/2022  Patient ID:  Prabhnoor, Ellenberger December 03, 1980, MRN [id number] PCP:  Ladell Pier, MD  Electrophysiologist: Dr. 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 CP, SOB, found with an acute cystitis, dehydrated, d/c on abx  08/02/21: ER visit with seizure, proceeded by aura.  Out of her keppra and metoprolol a few days Given IF keppra and lopressor and refills sent for her.  She did come to her wound check visit but no-showed to Dr. Curt Bears visit  MDT recall has come up requiring programming changes to shock polarities  + remotes  I saw her 08/18/21 She is doing well No device concerns. 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 No CP, palpitations or cardiac awareness. She has a couple months of refills now for both meds, no further seizures back on her keppra BPs at home are better then here, her partner an MA and checks manually at home Device therapy pathways programmed as per industry recommendations tachy therapy pathways were change to B>AX  Brief SVT noted, BB continued  ER visit 11/23/21 for CP near her device after a mechanical fall w/chest wall trauma, no obvious injury by exam , device checked with reported normal findings, improved pain after toradol  TODAY She is doing well, though BP has been high, she/her PMD are working on it, just  got started on amlodipine a few days ago, sees them again 03/30/22 for revisit on BP and ongoing management. No CP, SOB Infrequent, brief /fleeting flutter in her heart beat,  No near syncope or syncope. No shocks  She inquires about refills on her Keppra, discussed PMD would need to fill that/refer back to neurology (seen by [facility] neurology in the hospital, though no visits out pt)  Device information MDT dual chamber ICD implanted 01/12/2021   Past Medical History:  Diagnosis Date   Hypertension     Past Surgical History:  Procedure Laterality Date   ANKLE SURGERY Right    ELECTROPHYSIOLOGY STUDY N/A 01/12/2021   Procedure: ELECTROPHYSIOLOGY STUDY;  Surgeon: Constance Haw, MD;  Location: [facility];  Service: Cardiovascular;  Laterality: N/A;   ICD IMPLANT N/A 01/12/2021   Procedure: ICD IMPLANT;  Surgeon: Constance Haw, MD;  Location: [facility];  Service: Cardiovascular;  Laterality: N/A;    Current Outpatient Medications  Medication Sig Dispense Refill   amLODipine (NORVASC) 5 MG tablet Take 1 tablet by mouth once daily 90 tablet 3   FLUoxetine (PROZAC) 40 MG capsule Take 1 capsule (40 mg total) by mouth daily. 30 capsule 6   folic acid (FOLVITE) 1 MG tablet Take 1 tablet by mouth once daily 30 tablet 3   hydrOXYzine (ATARAX) 25 MG tablet Take 1/2-1 tablet (12.5-25 mg total) by mouth every 8 (eight) hours as needed. 30 tablet 3   levETIRAcetam (KEPPRA) 500 MG tablet Take 1 tablet (500  mg total) by mouth 2 (two) times daily. 180 tablet 0   levETIRAcetam (KEPPRA) 500 MG tablet Take 1 tablet (500 mg total) by mouth every 12 (twelve) hours. 60 tablet 3   metoprolol succinate (TOPROL-XL) 50 MG 24 hr tablet Take 1 tablet (50 mg total) by mouth daily. 90 tablet 3   metoprolol tartrate (LOPRESSOR) 25 MG tablet Take 1 tablet (25 mg total) by mouth 2 (two) times daily. 90 tablet 3   No current facility-administered medications for this visit.     Allergies:   Pineapple   Social History:  The patient  reports that she has quit smoking. Her smoking use included cigars. She has never used smokeless tobacco. She reports current alcohol use. She reports that she does not use drugs.   Family History:  The patient's family history includes Hypertension in an other family member.  ROS:  Please see the history of present illness.    All other systems are reviewed and otherwise negative.   PHYSICAL EXAM:  VS:  There were no vitals taken for this visit. BMI: There is no height or weight on file to calculate BMI. Well nourished, well developed, in no acute distress HEENT: normocephalic, atraumatic Neck: no JVD, carotid bruits or masses Cardiac: RRR; no significant murmurs, no rubs, or gallops Lungs: CTA b/l, no wheezing, rhonchi or rales Abd: soft, nontender MS: no deformity or atrophy Ext: no edema Skin: warm and dry, no rash Neuro:  No gross deficits appreciated Psych: euthymic mood, full affect  ICD site is stable, no tethering or discomfort   EKG:  not done today  Device interrogation done today and reviewed by myself:  Battery and lead measurements are good No arrhythmias  01/12/21: EPS CONCLUSIONS:  1. Sinus rhythm upon presentation.  2.  Negative EP study   C.MRI: 01/12/21 IMPRESSION: 1.  Normal LV size and function EF 53% 2.  Normal RV size and function no dysplasia 3.  Normal parametric measures 4.  No delayed gadolinium uptake 5.  Trivial LV posterior pericardial effusion 6.  Normal cardiac valves 7.  Normal ascending aortic root 3.0 cm   01/09/21: TTE  1. Left ventricular ejection fraction, by estimation, is 55 to 60%. The  left ventricle has normal function. The left ventricle has no regional  wall motion abnormalities. There is mild concentric left ventricular  hypertrophy. Left ventricular diastolic  parameters are consistent with Grade I diastolic dysfunction (impaired  relaxation).   2. Right  ventricular systolic function is normal. The right ventricular  size is normal. There is normal pulmonary artery systolic pressure.   3. The mitral valve is normal in structure. Trivial mitral valve  regurgitation. No evidence of mitral stenosis.   4. The aortic valve is tricuspid. Aortic valve regurgitation is not  visualized. No aortic stenosis is present.   5. The inferior vena cava is normal in size with greater than 50%  respiratory variability, suggesting right atrial pressure of 3 mmHg.   Recent Labs: 11/23/2021: BUN 15; Creatinine, Ser 0.99; Hemoglobin 10.9; Platelets 280; Potassium 3.6; Sodium 140  No results found for requested labs within last 365 days.   CrCl cannot be calculated (Patient's most recent lab result is older than the maximum 21 days allowed.).   Wt Readings from Last 3 Encounters:  11/23/21 215 lb (97.5 kg)  08/18/21 211 lb (95.7 kg)  08/02/21 225 lb (102.1 kg)     Other studies reviewed: Additional studies/records reviewed today include: summarized above  ASSESSMENT AND  PLAN:  ICD VT Intact function No programming changes made No AAD No VT  HTN Repeat by me is 168/98 She will continue management with her PMD, just got on amlodipine  3. SVT None by device Infrequent fleeting flutter     Disposition: remotes as usual, in clinic with Dr. Curt Bears in a year, sooner if needed   Current medicines are reviewed at length with the patient today.  The patient did not have any concerns regarding medicines.  Venetia Night, PA-C 03/23/2022 7:15 AM     [facility] [address] [place] [address] [phone] (office)  [phone] (fax)

## 2022-03-24 ENCOUNTER — Other Ambulatory Visit (HOSPITAL_COMMUNITY): Payer: Self-pay | Admitting: Emergency Medicine

## 2022-03-24 ENCOUNTER — Other Ambulatory Visit: Payer: Self-pay

## 2022-03-24 DIAGNOSIS — G40909 Epilepsy, unspecified, not intractable, without status epilepticus: Secondary | ICD-10-CM

## 2022-03-25 ENCOUNTER — Other Ambulatory Visit (HOSPITAL_COMMUNITY): Payer: Self-pay

## 2022-03-25 ENCOUNTER — Encounter: Payer: Self-pay | Admitting: Physician Assistant

## 2022-03-25 ENCOUNTER — Ambulatory Visit: Payer: Medicaid Other | Attending: Physician Assistant | Admitting: Physician Assistant

## 2022-03-25 ENCOUNTER — Encounter (HOSPITAL_COMMUNITY): Payer: Self-pay

## 2022-03-25 VITALS — BP 174/98 | HR 71 | Ht 67.0 in | Wt 213.4 lb

## 2022-03-25 DIAGNOSIS — I10 Essential (primary) hypertension: Secondary | ICD-10-CM

## 2022-03-25 DIAGNOSIS — I472 Ventricular tachycardia, unspecified: Secondary | ICD-10-CM | POA: Diagnosis not present

## 2022-03-25 DIAGNOSIS — Z95810 Presence of automatic (implantable) cardiac defibrillator: Secondary | ICD-10-CM | POA: Diagnosis not present

## 2022-03-25 DIAGNOSIS — I471 Supraventricular tachycardia, unspecified: Secondary | ICD-10-CM

## 2022-03-25 LAB — CUP PACEART INCLINIC DEVICE CHECK
Battery Remaining Longevity: 118 mo
Battery Voltage: 3.01 V
Brady Statistic AP VP Percent: 0.01 %
Brady Statistic AP VS Percent: 9.46 %
Brady Statistic AS VP Percent: 0.03 %
Brady Statistic AS VS Percent: 90.5 %
Brady Statistic RA Percent Paced: 9.46 %
Brady Statistic RV Percent Paced: 0.04 %
Date Time Interrogation Session: 20240201082133
HighPow Impedance: 61 Ohm
Implantable Lead Connection Status: 753985
Implantable Lead Connection Status: 753985
Implantable Lead Implant Date: 20221121
Implantable Lead Implant Date: 20221121
Implantable Lead Location: 753859
Implantable Lead Location: 753860
Implantable Lead Model: 5076
Implantable Pulse Generator Implant Date: 20221121
Lead Channel Impedance Value: 380 Ohm
Lead Channel Impedance Value: 437 Ohm
Lead Channel Impedance Value: 456 Ohm
Lead Channel Pacing Threshold Amplitude: 0.625 V
Lead Channel Pacing Threshold Amplitude: 0.625 V
Lead Channel Pacing Threshold Pulse Width: 0.4 ms
Lead Channel Pacing Threshold Pulse Width: 0.4 ms
Lead Channel Sensing Intrinsic Amplitude: 1.125 mV
Lead Channel Sensing Intrinsic Amplitude: 1.75 mV
Lead Channel Sensing Intrinsic Amplitude: 12.125 mV
Lead Channel Sensing Intrinsic Amplitude: 14.875 mV
Lead Channel Setting Pacing Amplitude: 1 V
Lead Channel Setting Pacing Amplitude: 1.5 V
Lead Channel Setting Pacing Pulse Width: 0.4 ms
Lead Channel Setting Sensing Sensitivity: 0.3 mV
Zone Setting Status: 755011
Zone Setting Status: 755011

## 2022-03-25 NOTE — Patient Instructions (Signed)
Medication Instructions:   Your physician recommends that you continue on your current medications as directed. Please refer to the Current Medication list given to you today.  *If you need a refill on your cardiac medications before your next appointment, please call your pharmacy*   Lab Work:  NONE ORDERED  TODAY   If you have labs (blood work) drawn today and your tests are completely normal, you will receive your results only by: MyChart Message (if you have MyChart) OR A paper copy in the mail If you have any lab test that is abnormal or we need to change your treatment, we will call you to review the results.   Testing/Procedures: NONE ORDERED  TODAY    Follow-Up: At [facility], you and your health needs are our priority.  As part of our continuing mission to provide you with exceptional heart care, we have created designated Provider Care Teams.  These Care Teams include your primary Cardiologist (physician) and Advanced Practice Providers (APPs -  Physician Assistants and Nurse Practitioners) who all work together to provide you with the care you need, when you need it.  We recommend signing up for the patient portal called "MyChart".  Sign up information is provided on this After Visit Summary.  MyChart is used to connect with patients for Virtual Visits (Telemedicine).  Patients are able to view lab/test results, encounter notes, upcoming appointments, etc.  Non-urgent messages can be sent to your provider as well.   To learn more about what you can do with MyChart, go to [email or web address].    Your next appointment:   1 year(s)  Provider:   Will Camnitz, MD    Other Instructions  

## 2022-03-29 ENCOUNTER — Other Ambulatory Visit: Payer: Self-pay

## 2022-03-30 ENCOUNTER — Encounter: Payer: Self-pay | Admitting: Neurology

## 2022-04-07 ENCOUNTER — Encounter: Payer: Self-pay | Admitting: Neurology

## 2022-04-07 ENCOUNTER — Other Ambulatory Visit (HOSPITAL_COMMUNITY): Payer: Self-pay

## 2022-04-07 ENCOUNTER — Other Ambulatory Visit: Payer: Self-pay

## 2022-04-07 ENCOUNTER — Ambulatory Visit: Payer: BC Managed Care – PPO | Admitting: Neurology

## 2022-04-07 VITALS — BP 164/124 | HR 74 | Ht 67.0 in | Wt 209.4 lb

## 2022-04-07 DIAGNOSIS — G40009 Localization-related (focal) (partial) idiopathic epilepsy and epileptic syndromes with seizures of localized onset, not intractable, without status epilepticus: Secondary | ICD-10-CM | POA: Diagnosis not present

## 2022-04-07 MED ORDER — LEVETIRACETAM 500 MG PO TABS
500.0000 mg | ORAL_TABLET | Freq: Two times a day (BID) | ORAL | 3 refills | Status: DC
  Filled 2022-04-07: qty 180, 90d supply, fill #0
  Filled 2022-08-02: qty 60, 30d supply, fill #1
  Filled 2022-09-08: qty 60, 30d supply, fill #2
  Filled 2022-11-12: qty 60, 30d supply, fill #3
  Filled 2023-01-04: qty 60, 30d supply, fill #4
  Filled 2023-03-11: qty 60, 30d supply, fill #5

## 2022-04-07 NOTE — Patient Instructions (Signed)
Good to meet you. Continue Levetiracetam (Keppra) 57m twice a day. Follow-up in 6 months, call for any changes.    Seizure Precautions: 1. If medication has been prescribed for you to prevent seizures, take it exactly as directed.  Do not stop taking the medicine without talking to your doctor first, even if you have not had a seizure in a long time.   2. Avoid activities in which a seizure would cause danger to yourself or to others.  Don't operate dangerous machinery, swim alone, or climb in high or dangerous places, such as on ladders, roofs, or girders.  Do not drive unless your doctor says you may.  3. If you have any warning that you may have a seizure, lay down in a safe place where you can't hurt yourself.    4.  No driving for 6 months from last seizure, as per [facility]   Please refer to the following link on the [place] for more information: http://www.epilepsyfoundation.org/answerplace/Social/driving/drivingu.cfm   5.  Maintain good sleep hygiene. Avoid alcohol.  6.  Notify your neurology if you are planning pregnancy or if you become pregnant.  7.  Contact your doctor if you have any problems that may be related to the medicine you are taking.  8.  Call 911 and bring the patient back to the ED if:        A.  The seizure lasts longer than 5 minutes.       B.  The patient doesn't awaken shortly after the seizure  C.  The patient has new problems such as difficulty seeing, speaking or moving  D.  The patient was injured during the seizure  E.  The patient has a temperature over 102 F (39C)  F.  The patient vomited and now is having trouble breathing

## 2022-04-07 NOTE — Progress Notes (Signed)
NEUROLOGY CONSULTATION NOTE  Amonie Quebedeaux MRN: [id number] DOB: [birth year]  Referring provider: Thayer Ohm, PA Primary care provider: Thayer Ohm, PA  Reason for consult:  seizures   Thank you for your kind referral of Martella Medine for consultation of the above symptoms. Although her history is well known to you, please allow me to reiterate it for the purpose of our medical record. The patient was accompanied to the clinic by her father-in-law who also provides collateral information. Records and images were personally reviewed where available.   HISTORY OF PRESENT ILLNESS: This is a pleasant 42 year old right-handed woman with a history of hypertension, VT s/p ICD placement, presenting to establish care for seizures. She was admitted to [facility] in 12/2020 after she had a seizure at work. She has no recollection of events, no prior warning, waking up on the ground with tongue bite. In the ER, she had a second GTC while in the CT scanner. During her admission, she had episodes of VT and was evaluated extensively by Cardiolgy/EP, ICD was placed for secondary prevention. She was evaluated by Neurology, and further history indicated that at age 14, she had an episode of facial twitching that lasted for a couple of minutes. She went to bed, then woke up with bilateral tongue bites and urinary incontinence. She did not seek care that time. Family also reported staring spells for 30 seconds where they cannot get her attention. She is unaware of this and denies being told of any staring/unresponsive episodes. 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 Routine EEG was normal. She was discharged home on Levetiracetam 514m BID. She was seizure-free until 08/02/2021. She works at [employer] felt a sensation of putting the dog  food down on the floor, which is unusual. She alerted her wife who told her to come home. When she got home, she started pacing back and forth (she does not remember this), then had a GTC, again biting her tongue. In the ER, she reported running out of Levetiracetam for 3-4 months due to insurance issues. She has been back to taking medication with no further seizures since then. She denies any  olfactory/gustatory hallucinations, deja vu, rising epigastric sensation, focal numbness/tingling/weakness, myoclonic jerks. Sometimes she may have slight twitches. She has occasional dizziness when she gets up. No diplopia, dysarthria/dysphagia, neck/back pain, bowel/bladder dysfunction. No side effects on LEV, mood is fine. She takes hydroxyzine which helps her get 8 hours of sleep. She denies any palpitations, chest pain, shortness of breath. Memory is good.   Epilepsy Risk Factors:  Paternal aunt had seizures. She had a normal birth and early development.  There is no history of febrile convulsions, CNS infections such as meningitis/encephalitis, significant traumatic brain injury, neurosurgical procedures.    PAST MEDICAL HISTORY: Past Medical History:  Diagnosis Date   Hypertension    [place]     PAST SURGICAL HISTORY: Past Surgical History:  Procedure Laterality Date   ANKLE SURGERY Right    ELECTROPHYSIOLOGY STUDY N/A 01/12/2021   Procedure: ELECTROPHYSIOLOGY STUDY;  Surgeon: CConstance Haw MD;  [facility];  Service: Cardiovascular;  Laterality: N/A;   ICD IMPLANT N/A 01/12/2021   Procedure: ICD IMPLANT;  Surgeon: CConstance Haw MD;  [facility];  Service: Cardiovascular;  Laterality: N/A;  MEDICATIONS: Current Outpatient Medications on File Prior to Visit  Medication Sig Dispense Refill   amLODipine (NORVASC) 5 MG tablet Take 1 tablet (5 mg total) by mouth daily. 90 tablet 3   FLUoxetine (PROZAC) 40 MG capsule Take 1 capsule (40 mg  total) by mouth daily. 30 capsule 6   folic acid (FOLVITE) 1 MG tablet Take 1 tablet (1 mg total) by mouth daily. 30 tablet 3   hydrOXYzine (ATARAX) 25 MG tablet Take 1/2-1 tablet (12.5-25 mg total) by mouth every 8 (eight) hours as needed. 30 tablet 3   levETIRAcetam (KEPPRA) 500 MG tablet Take 1 tablet (500 mg total) by mouth 2 (two) times daily. 180 tablet 0   levETIRAcetam (KEPPRA) 500 MG tablet Take 1 tablet (500 mg total) by mouth every 12 (twelve) hours. 60 tablet 3   metoprolol succinate (TOPROL-XL) 50 MG 24 hr tablet Take 1 tablet (50 mg total) by mouth daily. 90 tablet 3   No current facility-administered medications on file prior to visit.    ALLERGIES: Allergies  Allergen Reactions   Pineapple Rash    FAMILY HISTORY: Family History  Problem Relation Age of Onset   Hypertension Other     SOCIAL HISTORY: Social History   Socioeconomic History   Marital status: Single    Spouse name: Not on file   Number of children: Not on file   Years of education: Not on file   Highest education level: Not on file  Occupational History   Not on file  Tobacco Use   Smoking status: Former    Types: Cigars   Smokeless tobacco: Never  Vaping Use   Vaping Use: Never used  Substance and Sexual Activity   Alcohol use: Yes    Comment: socially   Drug use: Never   Sexual activity: Yes    Birth control/protection: None  Other Topics Concern   Not on file  Social History Narrative   Are you right handed or left handed? Right handed    Are you currently employed ? 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 no   Who lives with you? With her wife    What type of home do you live in: 1 story or 2 story?  2 story  15 steps        Social Determinants of Health   Financial Resource Strain: Medium Risk (10/13/2021)   Overall Financial Resource Strain (CARDIA)    Difficulty of Paying Living Expenses: Somewhat hard  Food Insecurity: No  Food Insecurity (10/13/2021)   Hunger Vital Sign    Worried About Running Out of Food in the Last Year: Never true    Ran Out of Food in the Last Year: Never true  Transportation Needs: No Transportation Needs (10/13/2021)   PRAPARE - Hydrologist (Medical): No    Lack of Transportation (Non-Medical): No  Physical Activity: Not on file  Stress: Not on file  Social Connections: Not on file  Intimate Partner Violence: Not on file     PHYSICAL EXAM: Vitals:   04/07/22 1359  BP: (!) 164/124  Pulse: 74  SpO2: 99%   General: No acute distress Head:  Normocephalic/atraumatic Skin/Extremities: No rash, no edema Neurological Exam: Mental status: alert and oriented to person, place, and time, no dysarthria or aphasia, Fund of knowledge is appropriate.  Recent and remote memory are intact, 2/3 delayed recall.  Attention and  concentration are normal, 5/5 WORLD backwards.  Cranial nerves: CN I: not tested CN II: pupils equal, round, visual fields intact CN III, IV, VI:  full range of motion, no nystagmus, no ptosis CN V: facial sensation intact CN VII: upper and lower face symmetric CN VIII: hearing intact to conversation Bulk & Tone: normal, no fasciculations. Motor: 5/5 throughout with no pronator drift. Sensation: intact to light touch, cold, pin, vibration sense.  No extinction to double simultaneous stimulation.  Romberg test negative Deep Tendon Reflexes: +2 on both UE, +1 both LE Cerebellar: no incoordination on finger to nose testing Gait: narrow-based and steady, able to tandem walk adequately. 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 MRI brain and  EEG unremarkable. She had a seizure in 07/2021 in the setting of running out of medication. Continue Levetiracetam 566m BID, refills sent. She does not drive. We discussed avoidance of seizure triggers, including missing medication, alcohol, sleep deprivation. Follow-up in 6 months, call for any changes.    Thank you for allowing me to participate in the care of this patient. Please do not hesitate to call for any questions or concerns.   KEllouise Newer M.D.  CC: OThayer Ohm PUtah

## 2022-04-13 ENCOUNTER — Ambulatory Visit (INDEPENDENT_AMBULATORY_CARE_PROVIDER_SITE_OTHER): Payer: BC Managed Care – PPO

## 2022-04-13 ENCOUNTER — Other Ambulatory Visit (HOSPITAL_COMMUNITY): Payer: Self-pay

## 2022-04-13 DIAGNOSIS — I472 Ventricular tachycardia, unspecified: Secondary | ICD-10-CM

## 2022-04-13 LAB — CUP PACEART REMOTE DEVICE CHECK
Battery Remaining Longevity: 117 mo
Battery Voltage: 3.02 V
Brady Statistic AP VP Percent: 0.01 %
Brady Statistic AP VS Percent: 10.4 %
Brady Statistic AS VP Percent: 0.02 %
Brady Statistic AS VS Percent: 89.57 %
Brady Statistic RA Percent Paced: 10.41 %
Brady Statistic RV Percent Paced: 0.03 %
Date Time Interrogation Session: 20240220001803
HighPow Impedance: 78 Ohm
Implantable Lead Connection Status: 753985
Implantable Lead Connection Status: 753985
Implantable Lead Implant Date: 20221121
Implantable Lead Implant Date: 20221121
Implantable Lead Location: 753859
Implantable Lead Location: 753860
Implantable Lead Model: 5076
Implantable Pulse Generator Implant Date: 20221121
Lead Channel Impedance Value: 437 Ohm
Lead Channel Impedance Value: 456 Ohm
Lead Channel Impedance Value: 513 Ohm
Lead Channel Pacing Threshold Amplitude: 0.5 V
Lead Channel Pacing Threshold Amplitude: 0.5 V
Lead Channel Pacing Threshold Pulse Width: 0.4 ms
Lead Channel Pacing Threshold Pulse Width: 0.4 ms
Lead Channel Sensing Intrinsic Amplitude: 1.25 mV
Lead Channel Sensing Intrinsic Amplitude: 1.25 mV
Lead Channel Sensing Intrinsic Amplitude: 11.625 mV
Lead Channel Sensing Intrinsic Amplitude: 11.625 mV
Lead Channel Setting Pacing Amplitude: 1 V
Lead Channel Setting Pacing Amplitude: 1.5 V
Lead Channel Setting Pacing Pulse Width: 0.4 ms
Lead Channel Setting Sensing Sensitivity: 0.3 mV
Zone Setting Status: 755011
Zone Setting Status: 755011

## 2022-05-13 NOTE — Progress Notes (Signed)
Remote ICD transmission.   

## 2022-07-02 ENCOUNTER — Emergency Department (HOSPITAL_COMMUNITY): Payer: BC Managed Care – PPO

## 2022-07-02 ENCOUNTER — Other Ambulatory Visit: Payer: Self-pay

## 2022-07-02 ENCOUNTER — Encounter (HOSPITAL_COMMUNITY): Payer: Self-pay

## 2022-07-02 ENCOUNTER — Emergency Department (HOSPITAL_COMMUNITY)
Admission: EM | Admit: 2022-07-02 | Discharge: 2022-07-02 | Disposition: A | Discharge status: Home / Self Care | Payer: BC Managed Care – PPO | Attending: Emergency Medicine | Admitting: Emergency Medicine

## 2022-07-02 DIAGNOSIS — K838 Other specified diseases of biliary tract: Secondary | ICD-10-CM | POA: Diagnosis not present

## 2022-07-02 DIAGNOSIS — R1110 Vomiting, unspecified: Secondary | ICD-10-CM | POA: Diagnosis not present

## 2022-07-02 DIAGNOSIS — K8020 Calculus of gallbladder without cholecystitis without obstruction: Secondary | ICD-10-CM | POA: Diagnosis not present

## 2022-07-02 DIAGNOSIS — R112 Nausea with vomiting, unspecified: Secondary | ICD-10-CM | POA: Diagnosis not present

## 2022-07-02 DIAGNOSIS — K7689 Other specified diseases of liver: Secondary | ICD-10-CM | POA: Diagnosis not present

## 2022-07-02 DIAGNOSIS — R748 Abnormal levels of other serum enzymes: Secondary | ICD-10-CM | POA: Diagnosis not present

## 2022-07-02 DIAGNOSIS — I10 Essential (primary) hypertension: Secondary | ICD-10-CM | POA: Diagnosis not present

## 2022-07-02 DIAGNOSIS — R1031 Right lower quadrant pain: Secondary | ICD-10-CM | POA: Diagnosis not present

## 2022-07-02 DIAGNOSIS — Z79899 Other long term (current) drug therapy: Secondary | ICD-10-CM | POA: Insufficient documentation

## 2022-07-02 LAB — URINALYSIS, ROUTINE W REFLEX MICROSCOPIC
Bilirubin Urine: NEGATIVE
Glucose, UA: NEGATIVE mg/dL
Ketones, ur: 20 mg/dL — AB
Leukocytes,Ua: NEGATIVE
Nitrite: NEGATIVE
Protein, ur: 100 mg/dL — AB
Specific Gravity, Urine: 1.023 (ref 1.005–1.030)
pH: 5 (ref 5.0–8.0)

## 2022-07-02 LAB — COMPREHENSIVE METABOLIC PANEL
ALT: 50 U/L — ABNORMAL HIGH (ref 0–44)
AST: 159 U/L — ABNORMAL HIGH (ref 15–41)
Albumin: 4.4 g/dL (ref 3.5–5.0)
Alkaline Phosphatase: 127 U/L — ABNORMAL HIGH (ref 38–126)
Anion gap: 21 — ABNORMAL HIGH (ref 5–15)
BUN: 11 mg/dL (ref 6–20)
CO2: 17 mmol/L — ABNORMAL LOW (ref 22–32)
Calcium: 8.9 mg/dL (ref 8.9–10.3)
Chloride: 98 mmol/L (ref 98–111)
Creatinine, Ser: 0.95 mg/dL (ref 0.44–1.00)
GFR, Estimated: >60 mL/min (ref >60)
Glucose, Bld: 89 mg/dL (ref 70–99)
Potassium: 3.3 mmol/L — ABNORMAL LOW (ref 3.5–5.1)
Sodium: 136 mmol/L (ref 135–145)
Total Bilirubin: 2.2 mg/dL — ABNORMAL HIGH (ref 0.3–1.2)
Total Protein: 7.8 g/dL (ref 6.5–8.1)

## 2022-07-02 LAB — RAPID URINE DRUG SCREEN, HOSP PERFORMED
Amphetamines: NOT DETECTED
Barbiturates: NOT DETECTED
Benzodiazepines: NOT DETECTED
Cocaine: NOT DETECTED
Opiates: NOT DETECTED
Tetrahydrocannabinol: NOT DETECTED

## 2022-07-02 LAB — I-STAT BETA HCG BLOOD, ED (MC, WL, AP ONLY): I-stat hCG, quantitative: <5 m[IU]/mL (ref <5)

## 2022-07-02 LAB — LIPASE, BLOOD: Lipase: 28 U/L (ref 11–51)

## 2022-07-02 LAB — CBC
HCT: 34.7 % — ABNORMAL LOW (ref 36.0–46.0)
Hemoglobin: 11.4 g/dL — ABNORMAL LOW (ref 12.0–15.0)
MCH: 31.8 pg (ref 26.0–34.0)
MCHC: 32.9 g/dL (ref 30.0–36.0)
MCV: 96.7 fL (ref 80.0–100.0)
Platelets: 222 10*3/uL (ref 150–400)
RBC: 3.59 MIL/uL — ABNORMAL LOW (ref 3.87–5.11)
RDW: 18.4 % — ABNORMAL HIGH (ref 11.5–15.5)
WBC: 4.7 10*3/uL (ref 4.0–10.5)
nRBC: 0 % (ref 0.0–0.2)

## 2022-07-02 MED ORDER — MORPHINE SULFATE (PF) 4 MG/ML IV SOLN
4.0000 mg | Freq: Once | INTRAVENOUS | Status: AC
  Administered 2022-07-02: 4 mg via INTRAVENOUS
  Filled 2022-07-02: qty 1

## 2022-07-02 MED ORDER — ONDANSETRON HCL 4 MG/2ML IJ SOLN
4.0000 mg | Freq: Once | INTRAMUSCULAR | Status: AC
  Administered 2022-07-02: 4 mg via INTRAVENOUS
  Filled 2022-07-02: qty 2

## 2022-07-02 MED ORDER — SODIUM CHLORIDE 0.9 % IV BOLUS
1000.0000 mL | Freq: Once | INTRAVENOUS | Status: AC
  Administered 2022-07-02: 1000 mL via INTRAVENOUS

## 2022-07-02 MED ORDER — IOHEXOL 350 MG/ML SOLN
75.0000 mL | Freq: Once | INTRAVENOUS | Status: AC | PRN
  Administered 2022-07-02: 75 mL via INTRAVENOUS

## 2022-07-02 MED ORDER — POTASSIUM CHLORIDE 10 MEQ/100ML IV SOLN
10.0000 meq | Freq: Once | INTRAVENOUS | Status: AC
  Administered 2022-07-02: 10 meq via INTRAVENOUS
  Filled 2022-07-02: qty 100

## 2022-07-02 MED ORDER — LABETALOL HCL 5 MG/ML IV SOLN
10.0000 mg | Freq: Once | INTRAVENOUS | Status: AC
  Administered 2022-07-02: 10 mg via INTRAVENOUS
  Filled 2022-07-02: qty 4

## 2022-07-02 MED ORDER — ONDANSETRON HCL 4 MG PO TABS
4.0000 mg | ORAL_TABLET | Freq: Four times a day (QID) | ORAL | 0 refills | Status: AC
  Filled 2022-07-02 – 2022-09-08 (×2): qty 12, 3d supply, fill #0

## 2022-07-02 MED ORDER — LEVETIRACETAM IN NACL 500 MG/100ML IV SOLN
500.0000 mg | Freq: Once | INTRAVENOUS | Status: AC
  Administered 2022-07-02: 500 mg via INTRAVENOUS
  Filled 2022-07-02: qty 100

## 2022-07-02 NOTE — ED Notes (Signed)
Patient transported to CT 

## 2022-07-02 NOTE — ED Triage Notes (Signed)
Pt c/o pain to lower abdomen and right groin for the past 2 weeks. Pt reports associated nausea and vomiting. Denies urinary symptoms

## 2022-07-02 NOTE — Discharge Instructions (Addendum)
Return for any problem.   Zofran as instructed for nausea.  Incidentally, gallstones were found in your gallbladder today.  You do not appear to be symptomatic from these today.  However, in the future if you develop right upper quadrant pain, fever, persistent abdominal pain to the upper abdomen, other symptoms suggestive of gallbladder issues, please seek medical attention.

## 2022-07-02 NOTE — ED Provider Notes (Signed)
[facility] Provider Note   CSN: [id number] Arrival date & time: 07/02/22  4098     History  Chief Complaint  Patient presents with   Groin Pain   Abdominal Pain    Sharon Hanson is a 42 y.o. female.  42 year old female with prior medical history as detailed below presents for evaluation.  Patient complains of persistent nausea and vomiting x 48 hours.  Patient reports that she has had difficulty taking her medications secondary to the vomiting.  She complains of persistent pain to the right lower quadrant right groin.  This pain has been present for at least 1 to 2 weeks.  She denies fevers.  She reports that her bowel movements have been intermittently loose at times.  She denies bloody stools.  She denies bloody emesis.    The history is provided by the patient and medical records.       Home Medications Prior to Admission medications   Medication Sig Start Date End Date Taking? Authorizing Provider  amLODipine (NORVASC) 5 MG tablet Take 1 tablet (5 mg total) by mouth daily. 03/02/22     FLUoxetine (PROZAC) 40 MG capsule Take 1 capsule (40 mg total) by mouth daily. 02/24/22     folic acid (FOLVITE) 1 MG tablet Take 1 tablet (1 mg total) by mouth daily. 03/02/22     hydrOXYzine (ATARAX) 25 MG tablet Take 1/2-1 tablet (12.5-25 mg total) by mouth every 8 (eight) hours as needed. 02/24/22     levETIRAcetam (KEPPRA) 500 MG tablet Take 1 tablet (500 mg total) by mouth 2 (two) times daily. 04/07/22   Van Clines, MD  metoprolol succinate (TOPROL-XL) 50 MG 24 hr tablet Take 1 tablet (50 mg total) by mouth daily. 02/24/22         Allergies    Pineapple    Review of Systems   Review of Systems  All other systems reviewed and are negative.   Physical Exam Updated Vital Signs BP (!) 150/99   Pulse 78   Temp 98.1 F (36.7 C)   Resp 12   Ht 5\' 7"  (1.702 m)   Wt 95.3 kg   SpO2 96%   BMI 32.89 kg/m  Physical Exam Vitals and  nursing note reviewed.  Constitutional:      General: She is not in acute distress.    Appearance: Normal appearance. She is well-developed.  HENT:     Head: Normocephalic and atraumatic.  Eyes:     Conjunctiva/sclera: Conjunctivae normal.     Pupils: Pupils are equal, round, and reactive to light.  Cardiovascular:     Rate and Rhythm: Normal rate and regular rhythm.     Heart sounds: Normal heart sounds.  Pulmonary:     Effort: Pulmonary effort is normal. No respiratory distress.     Breath sounds: Normal breath sounds.  Abdominal:     General: There is no distension.     Palpations: Abdomen is soft.     Tenderness: There is abdominal tenderness in the right lower quadrant.     Comments: Mild tenderness to palpation in the right lower quadrant into the right groin at the right inguinal ligament.  No appreciable mass or hernia.  No overlying erythema.  No crepitus.  Musculoskeletal:        General: No deformity. Normal range of motion.     Cervical back: Normal range of motion and neck supple.  Skin:    General: Skin is warm  and dry.  Neurological:     General: No focal deficit present.     Mental Status: She is alert and oriented to person, place, and time.     ED Results / Procedures / Treatments   Labs (all labs ordered are listed, but only abnormal results are displayed) Labs Reviewed  COMPREHENSIVE METABOLIC PANEL - Abnormal; Notable for the following components:      Result Value   Potassium 3.3 (*)    CO2 17 (*)    AST 159 (*)    ALT 50 (*)    Alkaline Phosphatase 127 (*)    Total Bilirubin 2.2 (*)    Anion gap 21 (*)    All other components within normal limits  CBC - Abnormal; Notable for the following components:   RBC 3.59 (*)    Hemoglobin 11.4 (*)    HCT 34.7 (*)    RDW 18.4 (*)    All other components within normal limits  LIPASE, BLOOD  URINALYSIS, ROUTINE W REFLEX MICROSCOPIC  RAPID URINE DRUG SCREEN, HOSP PERFORMED  I-STAT BETA HCG BLOOD, ED ([facility],  [facility], [facility] ONLY)    EKG EKG Interpretation  Date/Time:  Friday Jul 02 2022 07:43:44 EDT Ventricular Rate:  75 PR Interval:  112 QRS Duration: 95 QT Interval:  386 QTC Calculation: 432 R Axis:   73 Text Interpretation: Sinus rhythm Borderline short PR interval Confirmed by Kristine Royal [phone]) on 07/02/2022 7:45:39 AM  Radiology DG Chest 1 View  Result Date: 07/02/2022 CLINICAL DATA:  Vomiting EXAM: CHEST  1 VIEW COMPARISON:  11/23/2021 FINDINGS: Dual-chamber pacer leads from the left. Artifact from EKG leads. Normal heart size and mediastinal contours. No acute infiltrate or edema. No effusion or pneumothorax. No acute osseous findings. IMPRESSION: No active disease. Electronically Signed   By: Tiburcio Pea M.D.   On: 07/02/2022 07:38    Procedures Procedures    Medications Ordered in ED Medications  potassium chloride 10 mEq in 100 mL IVPB (has no administration in time range)  sodium chloride 0.9 % bolus 1,000 mL (1,000 mLs Intravenous New Bag/Given 07/02/22 0752)  ondansetron (ZOFRAN) injection 4 mg (4 mg Intravenous Given 07/02/22 0752)  levETIRAcetam (KEPPRA) IVPB 500 mg/100 mL premix (0 mg Intravenous Stopped 07/02/22 0814)  labetalol (NORMODYNE) injection 10 mg (10 mg Intravenous Given 07/02/22 0753)    ED Course/ Medical Decision Making/ A&P                             Medical Decision Making Amount and/or Complexity of Data Reviewed Labs: ordered. Radiology: ordered.  Risk Prescription drug management.    Medical Screen Complete  This patient presented to the ED with complaint of nausea, vomiting, right lower quadrant and groin pain.  This complaint involves an extensive number of treatment options. The initial differential diagnosis includes, but is not limited to, dehydration, AKI, intra-abdominal pathology such as internal hernia, etc.  This presentation is: Acute, Self-Limited, Previously Undiagnosed, Uncertain Prognosis, Complicated, Systemic Symptoms,  and Threat to Life/Bodily Function  Patient is presenting with complaints of nausea, vomiting, inability to take medications, persistent right groin and right lower quadrant abdominal pain.  Patient is notably tachycardic and hypertensive.  This is undoubtably related to patient's inability to take her p.o. medications this morning.  Patient given labetalol, a.m. dose of Keppra, IV fluids.  Screening labs obtained.  On the whole screening labs are without acute abnormality.  LFTs and total bili  are mildly elevated.  However these appear to be somewhat chronic elevations.  CT does not show significant acute abnormality.  However, patient's gallbladder is mildly distended on CT.  Right upper quadrant ultrasound demonstrates cholelithiasis without cholecystitis.  Patient made aware of CT and ultrasound findings.  Patient feels significantly improved after ED evaluation and treatment.  She is now taking p.o.  She desires discharge.  She does understand need for close outpatient follow-up with her PCP and also with general surgery for further workup of her newly diagnosed gallstones.  Strict return precautions given and understood.  Additional history obtained:  External records from outside sources obtained and reviewed including prior ED visits and prior Inpatient records.    Lab Tests:  I ordered and personally interpreted labs.  The pertinent results include: CBC, CMP, hCG, UA, lipase, urine tox   Imaging Studies ordered:  I ordered imaging studies including CT abdomen pelvis, plain films chest, right upper quadrant ultrasound I independently visualized and interpreted obtained imaging which showed gall stones I agree with the radiologist interpretation.   Cardiac Monitoring:  The patient was maintained on a cardiac monitor.  I personally viewed and interpreted the cardiac monitor which showed an underlying rhythm of: Sinus tach then NSR   Medicines ordered:  I ordered  medication including IV fluids, morphine, Zofran, labetalol, Keppra for dehydration, pain, nausea, hypertension, seizure prophylaxis Reevaluation of the patient after these medicines showed that the patient: improved    Problem List / ED Course:  Nausea, vomiting, right groin pain   Reevaluation:  After the interventions noted above, I reevaluated the patient and found that they have: improved   Disposition:  After consideration of the diagnostic results and the patients response to treatment, I feel that the patent would benefit from close outpatient follow-up.          Final Clinical Impression(s) / ED Diagnoses Final diagnoses:  Right inguinal pain  Calculus of gallbladder without cholecystitis without obstruction  Nausea and vomiting, unspecified vomiting type    Rx / DC Orders ED Discharge Orders          Ordered    ondansetron (ZOFRAN) 4 MG tablet  Every 6 hours        07/02/22 1138              Wynetta Fines, MD 07/02/22 1141

## 2022-07-02 NOTE — ED Notes (Signed)
Interrogated pt defibrillator awaiting for report.

## 2022-07-09 ENCOUNTER — Other Ambulatory Visit: Payer: Self-pay

## 2022-07-13 ENCOUNTER — Ambulatory Visit (INDEPENDENT_AMBULATORY_CARE_PROVIDER_SITE_OTHER): Payer: BC Managed Care – PPO

## 2022-07-13 DIAGNOSIS — I472 Ventricular tachycardia, unspecified: Secondary | ICD-10-CM

## 2022-07-13 LAB — CUP PACEART REMOTE DEVICE CHECK
Battery Remaining Longevity: 114 mo
Battery Voltage: 3.01 V
Brady Statistic AP VP Percent: 0.01 %
Brady Statistic AP VS Percent: 15.89 %
Brady Statistic AS VP Percent: 0.03 %
Brady Statistic AS VS Percent: 84.07 %
Brady Statistic RA Percent Paced: 15.9 %
Brady Statistic RV Percent Paced: 0.03 %
Date Time Interrogation Session: 20240521063526
HighPow Impedance: 53 Ohm
Implantable Lead Connection Status: 753985
Implantable Lead Connection Status: 753985
Implantable Lead Implant Date: 20221121
Implantable Lead Implant Date: 20221121
Implantable Lead Location: 753859
Implantable Lead Location: 753860
Implantable Lead Model: 5076
Implantable Pulse Generator Implant Date: 20221121
Lead Channel Impedance Value: 323 Ohm
Lead Channel Impedance Value: 399 Ohm
Lead Channel Impedance Value: 399 Ohm
Lead Channel Pacing Threshold Amplitude: 0.625 V
Lead Channel Pacing Threshold Amplitude: 0.625 V
Lead Channel Pacing Threshold Pulse Width: 0.4 ms
Lead Channel Pacing Threshold Pulse Width: 0.4 ms
Lead Channel Sensing Intrinsic Amplitude: 1.75 mV
Lead Channel Sensing Intrinsic Amplitude: 1.75 mV
Lead Channel Sensing Intrinsic Amplitude: 9.75 mV
Lead Channel Sensing Intrinsic Amplitude: 9.75 mV
Lead Channel Setting Pacing Amplitude: 1 V
Lead Channel Setting Pacing Amplitude: 1.5 V
Lead Channel Setting Pacing Pulse Width: 0.4 ms
Lead Channel Setting Sensing Sensitivity: 0.3 mV
Zone Setting Status: 755011
Zone Setting Status: 755011

## 2022-08-02 ENCOUNTER — Other Ambulatory Visit: Payer: Self-pay

## 2022-08-06 NOTE — Progress Notes (Signed)
Remote ICD transmission.   

## 2022-09-08 ENCOUNTER — Other Ambulatory Visit: Payer: Self-pay

## 2022-09-08 ENCOUNTER — Other Ambulatory Visit (HOSPITAL_COMMUNITY): Payer: Self-pay

## 2022-10-12 ENCOUNTER — Ambulatory Visit (INDEPENDENT_AMBULATORY_CARE_PROVIDER_SITE_OTHER): Payer: BC Managed Care – PPO

## 2022-10-12 DIAGNOSIS — R55 Syncope and collapse: Secondary | ICD-10-CM

## 2022-10-12 DIAGNOSIS — I472 Ventricular tachycardia, unspecified: Secondary | ICD-10-CM

## 2022-10-13 ENCOUNTER — Other Ambulatory Visit: Payer: Self-pay

## 2022-10-13 LAB — CUP PACEART REMOTE DEVICE CHECK
Battery Remaining Longevity: 111 mo
Battery Voltage: 3.01 V
Brady Statistic AP VP Percent: 0.01 %
Brady Statistic AP VS Percent: 10.04 %
Brady Statistic AS VP Percent: 0.03 %
Brady Statistic AS VS Percent: 89.93 %
Brady Statistic RA Percent Paced: 10.03 %
Brady Statistic RV Percent Paced: 0.03 %
Date Time Interrogation Session: 20240820132651
HighPow Impedance: 58 Ohm
Implantable Lead Connection Status: 753985
Implantable Lead Connection Status: 753985
Implantable Lead Implant Date: 20221121
Implantable Lead Implant Date: 20221121
Implantable Lead Location: 753859
Implantable Lead Location: 753860
Implantable Lead Model: 5076
Implantable Pulse Generator Implant Date: 20221121
Lead Channel Impedance Value: 323 Ohm
Lead Channel Impedance Value: 380 Ohm
Lead Channel Impedance Value: 399 Ohm
Lead Channel Pacing Threshold Amplitude: 0.5 V
Lead Channel Pacing Threshold Amplitude: 0.625 V
Lead Channel Pacing Threshold Pulse Width: 0.4 ms
Lead Channel Pacing Threshold Pulse Width: 0.4 ms
Lead Channel Sensing Intrinsic Amplitude: 1.5 mV
Lead Channel Sensing Intrinsic Amplitude: 1.5 mV
Lead Channel Sensing Intrinsic Amplitude: 9.75 mV
Lead Channel Sensing Intrinsic Amplitude: 9.75 mV
Lead Channel Setting Pacing Amplitude: 1 V
Lead Channel Setting Pacing Amplitude: 1.5 V
Lead Channel Setting Pacing Pulse Width: 0.4 ms
Lead Channel Setting Sensing Sensitivity: 0.3 mV
Zone Setting Status: 755011
Zone Setting Status: 755011

## 2022-10-22 NOTE — Progress Notes (Signed)
Remote ICD transmission.   

## 2022-10-30 ENCOUNTER — Emergency Department (HOSPITAL_COMMUNITY): Payer: BC Managed Care – PPO

## 2022-10-30 ENCOUNTER — Other Ambulatory Visit: Payer: Self-pay

## 2022-10-30 ENCOUNTER — Encounter (HOSPITAL_COMMUNITY): Payer: Self-pay

## 2022-10-30 ENCOUNTER — Emergency Department (HOSPITAL_COMMUNITY)
Admission: EM | Admit: 2022-10-30 | Discharge: 2022-10-30 | Disposition: A | Discharge status: Home / Self Care | Payer: BC Managed Care – PPO | Attending: Emergency Medicine | Admitting: Emergency Medicine

## 2022-10-30 DIAGNOSIS — Z79899 Other long term (current) drug therapy: Secondary | ICD-10-CM | POA: Insufficient documentation

## 2022-10-30 DIAGNOSIS — I10 Essential (primary) hypertension: Secondary | ICD-10-CM | POA: Diagnosis not present

## 2022-10-30 DIAGNOSIS — M542 Cervicalgia: Secondary | ICD-10-CM | POA: Diagnosis not present

## 2022-10-30 DIAGNOSIS — Z95810 Presence of automatic (implantable) cardiac defibrillator: Secondary | ICD-10-CM | POA: Diagnosis not present

## 2022-10-30 DIAGNOSIS — R030 Elevated blood-pressure reading, without diagnosis of hypertension: Secondary | ICD-10-CM

## 2022-10-30 DIAGNOSIS — M50221 Other cervical disc displacement at C4-C5 level: Secondary | ICD-10-CM | POA: Diagnosis not present

## 2022-10-30 DIAGNOSIS — R1110 Vomiting, unspecified: Secondary | ICD-10-CM | POA: Diagnosis not present

## 2022-10-30 DIAGNOSIS — R0989 Other specified symptoms and signs involving the circulatory and respiratory systems: Secondary | ICD-10-CM | POA: Diagnosis not present

## 2022-10-30 DIAGNOSIS — R079 Chest pain, unspecified: Secondary | ICD-10-CM | POA: Diagnosis not present

## 2022-10-30 DIAGNOSIS — R110 Nausea: Secondary | ICD-10-CM | POA: Diagnosis not present

## 2022-10-30 DIAGNOSIS — I517 Cardiomegaly: Secondary | ICD-10-CM | POA: Diagnosis not present

## 2022-10-30 DIAGNOSIS — Z950 Presence of cardiac pacemaker: Secondary | ICD-10-CM | POA: Diagnosis not present

## 2022-10-30 LAB — CBC WITH DIFFERENTIAL/PLATELET
Abs Immature Granulocytes: 0.01 10*3/uL (ref 0.00–0.07)
Basophils Absolute: 0 10*3/uL (ref 0.0–0.1)
Basophils Relative: 1 %
Eosinophils Absolute: 0.1 10*3/uL (ref 0.0–0.5)
Eosinophils Relative: 2 %
HCT: 33.1 % — ABNORMAL LOW (ref 36.0–46.0)
Hemoglobin: 10.8 g/dL — ABNORMAL LOW (ref 12.0–15.0)
Immature Granulocytes: 0 %
Lymphocytes Relative: 53 %
Lymphs Abs: 2.4 10*3/uL (ref 0.7–4.0)
MCH: 34.2 pg — ABNORMAL HIGH (ref 26.0–34.0)
MCHC: 32.6 g/dL (ref 30.0–36.0)
MCV: 104.7 fL — ABNORMAL HIGH (ref 80.0–100.0)
Monocytes Absolute: 0.4 10*3/uL (ref 0.1–1.0)
Monocytes Relative: 9 %
Neutro Abs: 1.5 10*3/uL — ABNORMAL LOW (ref 1.7–7.7)
Neutrophils Relative %: 35 %
Platelets: 376 10*3/uL (ref 150–400)
RBC: 3.16 MIL/uL — ABNORMAL LOW (ref 3.87–5.11)
RDW: 16.3 % — ABNORMAL HIGH (ref 11.5–15.5)
WBC: 4.4 10*3/uL (ref 4.0–10.5)
nRBC: 0 % (ref 0.0–0.2)

## 2022-10-30 LAB — COMPREHENSIVE METABOLIC PANEL
ALT: 20 U/L (ref 0–44)
AST: 38 U/L (ref 15–41)
Albumin: 4.1 g/dL (ref 3.5–5.0)
Alkaline Phosphatase: 73 U/L (ref 38–126)
Anion gap: 11 (ref 5–15)
BUN: 15 mg/dL (ref 6–20)
CO2: 23 mmol/L (ref 22–32)
Calcium: 9 mg/dL (ref 8.9–10.3)
Chloride: 105 mmol/L (ref 98–111)
Creatinine, Ser: 0.93 mg/dL (ref 0.44–1.00)
GFR, Estimated: >60 mL/min (ref >60)
Glucose, Bld: 106 mg/dL — ABNORMAL HIGH (ref 70–99)
Potassium: 3.1 mmol/L — ABNORMAL LOW (ref 3.5–5.1)
Sodium: 139 mmol/L (ref 135–145)
Total Bilirubin: 1.1 mg/dL (ref 0.3–1.2)
Total Protein: 7.2 g/dL (ref 6.5–8.1)

## 2022-10-30 LAB — LIPASE, BLOOD: Lipase: 32 U/L (ref 11–51)

## 2022-10-30 LAB — HCG, SERUM, QUALITATIVE: Preg, Serum: NEGATIVE

## 2022-10-30 LAB — TROPONIN I (HIGH SENSITIVITY): Troponin I (High Sensitivity): 4 ng/L (ref <18)

## 2022-10-30 MED ORDER — SODIUM CHLORIDE 0.9 % IV BOLUS
1000.0000 mL | Freq: Once | INTRAVENOUS | Status: AC
  Administered 2022-10-30: 1000 mL via INTRAVENOUS

## 2022-10-30 MED ORDER — METHOCARBAMOL 500 MG PO TABS
500.0000 mg | ORAL_TABLET | Freq: Two times a day (BID) | ORAL | 0 refills | Status: AC
  Filled 2022-10-30 – 2022-11-01 (×2): qty 20, 10d supply, fill #0

## 2022-10-30 MED ORDER — METOPROLOL SUCCINATE ER 25 MG PO TB24
50.0000 mg | ORAL_TABLET | Freq: Every day | ORAL | Status: DC
  Administered 2022-10-30: 50 mg via ORAL
  Filled 2022-10-30: qty 2

## 2022-10-30 MED ORDER — AMLODIPINE BESYLATE 5 MG PO TABS
5.0000 mg | ORAL_TABLET | Freq: Once | ORAL | Status: AC
  Administered 2022-10-30: 5 mg via ORAL
  Filled 2022-10-30: qty 1

## 2022-10-30 MED ORDER — POTASSIUM CHLORIDE CRYS ER 20 MEQ PO TBCR
40.0000 meq | EXTENDED_RELEASE_TABLET | Freq: Once | ORAL | Status: AC
  Administered 2022-10-30: 40 meq via ORAL
  Filled 2022-10-30: qty 2

## 2022-10-30 MED ORDER — LEVETIRACETAM IN NACL 500 MG/100ML IV SOLN
500.0000 mg | Freq: Once | INTRAVENOUS | Status: AC
  Administered 2022-10-30: 500 mg via INTRAVENOUS
  Filled 2022-10-30: qty 100

## 2022-10-30 MED ORDER — HYDROCODONE-ACETAMINOPHEN 5-325 MG PO TABS
1.0000 | ORAL_TABLET | Freq: Four times a day (QID) | ORAL | 0 refills | Status: AC | PRN
  Filled 2022-10-30 – 2022-11-01 (×2): qty 6, 2d supply, fill #0

## 2022-10-30 MED ORDER — MORPHINE SULFATE (PF) 4 MG/ML IV SOLN
4.0000 mg | Freq: Once | INTRAVENOUS | Status: AC
  Administered 2022-10-30: 4 mg via INTRAVENOUS
  Filled 2022-10-30: qty 1

## 2022-10-30 MED ORDER — ONDANSETRON 4 MG PO TBDP
4.0000 mg | ORAL_TABLET | Freq: Three times a day (TID) | ORAL | 0 refills | Status: AC | PRN
  Filled 2022-10-30: qty 20, 7d supply, fill #0
  Filled 2022-11-01: qty 18, 6d supply, fill #0

## 2022-10-30 MED ORDER — ONDANSETRON HCL 4 MG/2ML IJ SOLN
4.0000 mg | Freq: Once | INTRAMUSCULAR | Status: AC
  Administered 2022-10-30: 4 mg via INTRAVENOUS
  Filled 2022-10-30: qty 2

## 2022-10-30 MED ORDER — METHOCARBAMOL 500 MG PO TABS
500.0000 mg | ORAL_TABLET | Freq: Once | ORAL | Status: AC
  Administered 2022-10-30: 500 mg via ORAL
  Filled 2022-10-30: qty 1

## 2022-10-30 MED ORDER — LIDOCAINE 5 % EX PTCH
1.0000 | MEDICATED_PATCH | CUTANEOUS | Status: DC
  Administered 2022-10-30: 1 via TRANSDERMAL
  Filled 2022-10-30: qty 1

## 2022-10-30 MED ORDER — HYDROCODONE-ACETAMINOPHEN 5-325 MG PO TABS
1.0000 | ORAL_TABLET | Freq: Once | ORAL | Status: AC
  Administered 2022-10-30: 1 via ORAL
  Filled 2022-10-30: qty 1

## 2022-10-30 NOTE — ED Triage Notes (Signed)
Pt arrived POV from home c/o neck pain x3 days and nausea and vomiting. Pt states she has been unable to keep her Keppra or metoprolol down for 3 days.

## 2022-10-30 NOTE — ED Notes (Signed)
Patient transported to CT 

## 2022-10-30 NOTE — Discharge Instructions (Addendum)
It was a pleasure taking care of you today. Your CT scan showed a slight herniated disc. I have included the number of the neurosurgeon. Call to schedule an appointment for further evaluation.   Your CT scan also showed an empty sella which is a part of your brain. Sometimes this can be normal. Please follow-up with PCP for further evaluation  Your BP was elevated while you were in the ER likely due to not taking your BP medications. Have your BP rechecked on Monday. Continue taking your medications.

## 2022-10-30 NOTE — ED Provider Notes (Signed)
EMERGENCY DEPARTMENT AT [facility] Provider Note   CSN: [id number] Arrival date & time: 10/30/22  0746     History  Chief Complaint  Patient presents with   Neck Pain   Nausea    Sharon Hanson is a 42 y.o. female with a past medical history significant for seizures, hypertension, and ICD in place who presents to the ED due to neck pain x 3 days.  Patient states she was hit in the back of the neck while at work by coworker carrying a tray of dough.  Admits to significant pain to lower cervical spine/upper thoracic spine which radiates to the left side of her shoulder/chest.  Also admits to some central chest pain for the past 3 days.  Pain worse with movement of left arm.  Admits to some numbness/tingling down left lower extremity.  No fever or chills.  No IV drug use.  Denies weakness of left upper extremity.  No history of blood clots.  No lower extremity edema.  No shortness of breath.  Patient also admits to 2-3 episodes of nonbloody, nonbilious emesis.  Patient unable to tolerate her Keppra or metoprolol.  She notes this is typical for her whenever she is in pain.  Denies abdominal pain.  No diarrhea.  No fever or chills.  History obtained from patient and past medical records. No interpreter used during encounter.       Home Medications Prior to Admission medications   Medication Sig Start Date End Date Taking? Authorizing Provider  amLODipine (NORVASC) 5 MG tablet Take 1 tablet (5 mg total) by mouth daily. 03/02/22     FLUoxetine (PROZAC) 40 MG capsule Take 1 capsule (40 mg total) by mouth daily. 02/24/22     folic acid (FOLVITE) 1 MG tablet Take 1 tablet (1 mg total) by mouth daily. 03/02/22     hydrOXYzine (ATARAX) 25 MG tablet Take 1/2-1 tablet (12.5-25 mg total) by mouth every 8 (eight) hours as needed. 02/24/22     levETIRAcetam (KEPPRA) 500 MG tablet Take 1 tablet (500 mg total) by mouth 2 (two) times daily. 04/07/22   Van Clines, MD  metoprolol  succinate (TOPROL-XL) 50 MG 24 hr tablet Take 1 tablet (50 mg total) by mouth daily. 02/24/22     ondansetron (ZOFRAN) 4 MG tablet Take 1 tablet (4 mg total) by mouth every 6 (six) hours. 07/02/22   Wynetta Fines, MD      Allergies    Pineapple    Review of Systems   Review of Systems  Constitutional:  Negative for fever.  Gastrointestinal:  Positive for nausea and vomiting. Negative for abdominal pain.  Musculoskeletal:  Positive for neck pain.  Neurological:  Positive for numbness.    Physical Exam Updated Vital Signs BP (!) 156/94   Pulse 75   Temp 98.2 F (36.8 C) (Oral)   Resp 16   Ht 5\' 7"  (1.702 m)   Wt 90.7 kg   SpO2 100%   BMI 31.32 kg/m  Physical Exam Vitals and nursing note reviewed.  Constitutional:      General: She is not in acute distress.    Appearance: She is not ill-appearing.  HENT:     Head: Normocephalic.  Eyes:     Pupils: Pupils are equal, round, and reactive to light.  Neck:     Comments: No cervical midline tenderness. Equal grip strength.  Cardiovascular:     Rate and Rhythm: Normal rate and regular rhythm.  Pulses: Normal pulses.     Heart sounds: Normal heart sounds. No murmur heard.    No friction rub. No gallop.  Pulmonary:     Effort: Pulmonary effort is normal.     Breath sounds: Normal breath sounds.  Abdominal:     General: Abdomen is flat. There is no distension.     Palpations: Abdomen is soft.     Tenderness: There is no abdominal tenderness. There is no guarding or rebound.  Musculoskeletal:        General: Normal range of motion.     Cervical back: Neck supple.     Comments: Reproducible tenderness throughout anterior aspect of left shoulder into left side of anterior chest wall.  Skin:    General: Skin is warm and dry.  Neurological:     General: No focal deficit present.     Mental Status: She is alert.  Psychiatric:        Mood and Affect: Mood normal.        Behavior: Behavior normal.     ED Results /  Procedures / Treatments   Labs (all labs ordered are listed, but only abnormal results are displayed) Labs Reviewed  CBC WITH DIFFERENTIAL/PLATELET - Abnormal; Notable for the following components:      Result Value   RBC 3.16 (*)    Hemoglobin 10.8 (*)    HCT 33.1 (*)    MCV 104.7 (*)    MCH 34.2 (*)    RDW 16.3 (*)    Neutro Abs 1.5 (*)    All other components within normal limits  COMPREHENSIVE METABOLIC PANEL - Abnormal; Notable for the following components:   Potassium 3.1 (*)    Glucose, Bld 106 (*)    All other components within normal limits  LIPASE, BLOOD  HCG, SERUM, QUALITATIVE  TROPONIN I (HIGH SENSITIVITY)    EKG EKG Interpretation Date/Time:  Saturday October 30 2022 09:02:36 EDT Ventricular Rate:  60 PR Interval:  130 QRS Duration:  101 QT Interval:  433 QTC Calculation: 433 R Axis:   74  Text Interpretation: Sinus rhythm Borderline repolarization abnormality Confirmed by Kristine Royal [phone]) on 10/30/2022 9:24:20 AM  Radiology CT Cervical Spine Wo Contrast  Result Date: 10/30/2022 CLINICAL DATA:  42 year old female with 3 days of neck pain, nausea vomiting. EXAM: CT CERVICAL SPINE WITHOUT CONTRAST TECHNIQUE: Multidetector CT imaging of the cervical spine was performed without intravenous contrast. Multiplanar CT image reconstructions were also generated. RADIATION DOSE REDUCTION: This exam was performed according to the departmental dose-optimization program which includes automated exposure control, adjustment of the mA and/or kV according to patient size and/or use of iterative reconstruction technique. COMPARISON:  None Available. FINDINGS: Alignment: Mild reversal of the normal cervical lordosis. Cervicothoracic junction alignment is within normal limits. Bilateral posterior element alignment is within normal limits. Skull base and vertebrae: Bone mineralization is within normal limits. Visualized skull base is intact. No atlanto-occipital dissociation. C1  and C2 appear intact and aligned. No acute osseous abnormality identified. Soft tissues and spinal canal: No prevertebral fluid or swelling. No visible canal hematoma. Negative visible noncontrast neck soft tissues. Disc levels: Multilevel cervical disc bulging with mild endplate spurring. This includes a central disc protrusion at C4-C5 visible on series 6, image 54. Mild if any associated cervical spinal stenosis. Upper chest: Partially visible left chest cardiac pacer. Visible upper thoracic levels and lung apices appear negative. Other: Negative cervicomedullary junction, visible posterior fossa. Partially empty sella. Visible paranasal sinuses, middle ears and  mastoids are clear. IMPRESSION: 1.  No acute osseous abnormality in the cervical spine. 2. 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 But mild if any associated cervical spinal stenosis suspected by CT. 3. Left chest cardiac pacemaker type device. 4. Partially empty sella which is often a normal anatomic variant but can be associated with idiopathic intracranial hypertension (pseudotumor cerebri). Electronically Signed   By: Odessa Fleming M.D.   On: 10/30/2022 08:57   DG Chest Portable 1 View  Result Date: 10/30/2022 CLINICAL DATA:  42 year old female with history of chest pain. EXAM: PORTABLE CHEST 1 VIEW COMPARISON:  Chest x-ray 07/02/2022. FINDINGS: Lung volumes are low. No consolidative airspace disease. No pleural effusions. No pneumothorax. No evidence of pulmonary edema. Heart size is enlarged. Upper mediastinal contours are within normal limits. Left-sided pacemaker/AICD again noted, with lead tips projecting over the expected location of the right atrium and right ventricle. IMPRESSION: 1. No radiographic evidence of acute cardiopulmonary disease. 2. Cardiomegaly. Electronically Signed   By: Trudie Reed M.D.   On: 10/30/2022 08:34    Procedures Procedures    Medications Ordered in ED Medications  lidocaine  (LIDODERM) 5 % 1 patch (1 patch Transdermal Patch Applied 10/30/22 0912)  metoprolol succinate (TOPROL-XL) 24 hr tablet 50 mg (50 mg Oral Given 10/30/22 1151)  sodium chloride 0.9 % bolus 1,000 mL (1,000 mLs Intravenous New Bag/Given 10/30/22 0912)  ondansetron (ZOFRAN) injection 4 mg (4 mg Intravenous Given 10/30/22 0851)  levETIRAcetam (KEPPRA) IVPB 500 mg/100 mL premix (0 mg Intravenous Stopped 10/30/22 0912)  morphine (PF) 4 MG/ML injection 4 mg (4 mg Intravenous Given 10/30/22 0853)  methocarbamol (ROBAXIN) tablet 500 mg (500 mg Oral Given 10/30/22 1152)  HYDROcodone-acetaminophen (NORCO/VICODIN) 5-325 MG per tablet 1 tablet (1 tablet Oral Given 10/30/22 1152)  amLODipine (NORVASC) tablet 5 mg (5 mg Oral Given 10/30/22 1152)  potassium chloride SA (KLOR-CON M) CR tablet 40 mEq (40 mEq Oral Given 10/30/22 1150)    ED Course/ Medical Decision Making/ A&P Clinical Course as of 10/30/22 1323  Sat Oct 30, 2022  1109 Reassessed patient at bedside.  Patient resting comfortably in bed.  Still admits to some posterior neck pain.  No upper extremity weakness.  CT cervical spine negative for any bony fractures.  Does demonstrate disc protrusion.  Low suspicion for central cord compression.  No lower extremity symptoms.  Will give another dose of pain medication.  Patient states she has not taken her BP medications today.  BP elevated.  Home dose given. [CA]  1308 Reassessed patient. Admits to significant improvement in pain. Ready to be discharged.  [CA]    Clinical Course User Index [CA] Mannie Stabile, PA-C                                 Medical Decision Making Amount and/or Complexity of Data Reviewed Labs: ordered. Decision-making details documented in ED Course. Radiology: ordered and independent interpretation performed. Decision-making details documented in ED Course. ECG/medicine tests: ordered and independent interpretation performed. Decision-making details documented in ED  Course.  Risk Prescription drug management.   This patient presents to the ED for concern of neck pain, this involves an extensive number of treatment options, and is a complaint that carries with it a high risk of complications and morbidity.  The differential diagnosis includes bony fracture, central cord compression, ACS, dissection, etc  42 year old female presents to the ED due to  neck pain x 3 days after getting hit in the neck while at work at [employer] with a tray.  Denies weakness to upper extremities.  Admits to some numbness/tended to left upper extremity.  Also admits to 2-3 episodes of nonbloody, nonbilious emesis which typically occurs when patient is in pain.  Upon arrival, patient hypertensive at 185/103 with otherwise unremarkable vitals.  Patient in no acute distress.  No cervical midline tenderness.  Equal grip strength.  Low suspicion for central cord compression.  Reproducible tenderness throughout anterior aspect of left shoulder into left side of chest wall.  Full range of motion of left shoulder. No evidence of infection. Routine labs ordered.  Troponin to rule out cardiac etiology of symptoms however, my suspicion is lower given history.  Possible MSK etiology of symptoms.  IV fluids and Zofran given.  Dose of Keppra given.  Pain medication given. CT cervical spine given trauma.   CBC reassuring.  No leukocytosis.  Hemoglobin at 10.8 which appears to be around patient's baseline.  CMP significant for hypokalemia at 3.1.  Potassium repleted.  Normal renal function.  Lipase normal.  Low suspicion for pancreatitis.  Troponin normal.  EKG demonstrates normal sinus rhythm.  No signs of acute ischemia.  Low suspicion for ACS.  Suspect chest pain likely secondary to MSK etiology given reproducible nature on exam.  Low suspicion for PE/DVT.  Presentation nonconcerning for aortic dissection.  CT spine negative for any bony fractures.  Does demonstrate degeneration with central disc protrusion  at C4-C5.  No upper extremity weakness.  Low suspicion for central cord compression.  Patient made aware of incidental findings of empty sella.  Neurosurgery number given to patient discharge for disc protrusion. Upon reassessment, patient admits to improvement in pain. Patient stable for discharge. Strict ED precautions discussed with patient. Patient states understanding and agrees to plan. Patient discharged home in no acute distress and stable vitals  Lives at home Hx HTN Has PCP       Final Clinical Impression(s) / ED Diagnoses Final diagnoses:  Neck pain  Elevated blood pressure reading    Rx / DC Orders ED Discharge Orders     None         Jesusita Oka 10/30/22 1324    Wynetta Fines, MD 10/30/22 1616

## 2022-11-01 ENCOUNTER — Other Ambulatory Visit: Payer: Self-pay

## 2022-11-10 ENCOUNTER — Other Ambulatory Visit (HOSPITAL_COMMUNITY): Payer: Self-pay

## 2022-11-10 DIAGNOSIS — I10 Essential (primary) hypertension: Secondary | ICD-10-CM | POA: Diagnosis not present

## 2022-11-10 DIAGNOSIS — E538 Deficiency of other specified B group vitamins: Secondary | ICD-10-CM | POA: Diagnosis not present

## 2022-11-10 DIAGNOSIS — E876 Hypokalemia: Secondary | ICD-10-CM | POA: Diagnosis not present

## 2022-11-10 DIAGNOSIS — Z0000 Encounter for general adult medical examination without abnormal findings: Secondary | ICD-10-CM | POA: Diagnosis not present

## 2022-11-10 MED ORDER — LOSARTAN POTASSIUM 50 MG PO TABS
50.0000 mg | ORAL_TABLET | Freq: Every day | ORAL | 6 refills | Status: AC
  Filled 2022-11-10: qty 30, 30d supply, fill #0
  Filled 2023-03-11: qty 30, 30d supply, fill #1
  Filled 2023-05-21: qty 30, 30d supply, fill #2

## 2022-11-11 ENCOUNTER — Other Ambulatory Visit: Payer: Self-pay

## 2022-11-11 ENCOUNTER — Other Ambulatory Visit (HOSPITAL_COMMUNITY): Payer: Self-pay

## 2022-11-11 DIAGNOSIS — M542 Cervicalgia: Secondary | ICD-10-CM | POA: Diagnosis not present

## 2022-11-11 MED ORDER — METHYLPREDNISOLONE 4 MG PO TBPK
ORAL_TABLET | ORAL | 0 refills | Status: DC
  Filled 2022-11-11: qty 21, 6d supply, fill #0

## 2022-11-12 ENCOUNTER — Other Ambulatory Visit (HOSPITAL_COMMUNITY): Payer: Self-pay

## 2022-11-14 ENCOUNTER — Other Ambulatory Visit (HOSPITAL_COMMUNITY): Payer: Self-pay

## 2022-11-15 ENCOUNTER — Other Ambulatory Visit: Payer: Self-pay

## 2022-11-16 ENCOUNTER — Encounter: Payer: Self-pay | Admitting: Pharmacist

## 2022-11-16 ENCOUNTER — Other Ambulatory Visit: Payer: Self-pay

## 2022-11-18 ENCOUNTER — Other Ambulatory Visit: Payer: Self-pay

## 2022-11-23 ENCOUNTER — Other Ambulatory Visit: Payer: Self-pay

## 2022-11-23 ENCOUNTER — Other Ambulatory Visit (HOSPITAL_COMMUNITY): Payer: Self-pay

## 2022-11-23 ENCOUNTER — Encounter (HOSPITAL_COMMUNITY): Payer: Self-pay

## 2022-11-26 ENCOUNTER — Other Ambulatory Visit: Payer: Self-pay

## 2022-12-01 DIAGNOSIS — G40909 Epilepsy, unspecified, not intractable, without status epilepticus: Secondary | ICD-10-CM | POA: Diagnosis not present

## 2022-12-01 DIAGNOSIS — Z3169 Encounter for other general counseling and advice on procreation: Secondary | ICD-10-CM | POA: Diagnosis not present

## 2022-12-01 DIAGNOSIS — I10 Essential (primary) hypertension: Secondary | ICD-10-CM | POA: Diagnosis not present

## 2022-12-01 DIAGNOSIS — Z319 Encounter for procreative management, unspecified: Secondary | ICD-10-CM | POA: Diagnosis not present

## 2022-12-06 ENCOUNTER — Other Ambulatory Visit: Payer: Self-pay

## 2022-12-06 ENCOUNTER — Other Ambulatory Visit (HOSPITAL_COMMUNITY): Payer: Self-pay

## 2022-12-06 MED ORDER — LABETALOL HCL 100 MG PO TABS
100.0000 mg | ORAL_TABLET | Freq: Two times a day (BID) | ORAL | 4 refills | Status: AC
  Filled 2022-12-06: qty 60, 30d supply, fill #0
  Filled 2023-01-04: qty 60, 30d supply, fill #1
  Filled 2023-03-11: qty 60, 30d supply, fill #2
  Filled 2023-05-21: qty 60, 30d supply, fill #3

## 2022-12-07 ENCOUNTER — Other Ambulatory Visit: Payer: Self-pay

## 2023-01-04 ENCOUNTER — Other Ambulatory Visit (HOSPITAL_COMMUNITY): Payer: Self-pay

## 2023-01-11 ENCOUNTER — Ambulatory Visit (INDEPENDENT_AMBULATORY_CARE_PROVIDER_SITE_OTHER): Payer: Medicaid Other

## 2023-01-11 DIAGNOSIS — I472 Ventricular tachycardia, unspecified: Secondary | ICD-10-CM

## 2023-01-11 LAB — CUP PACEART REMOTE DEVICE CHECK
Battery Remaining Longevity: 108 mo
Battery Voltage: 3.01 V
Brady Statistic AP VP Percent: 0.01 %
Brady Statistic AP VS Percent: 9.94 %
Brady Statistic AS VP Percent: 0.02 %
Brady Statistic AS VS Percent: 90.02 %
Brady Statistic RA Percent Paced: 9.95 %
Brady Statistic RV Percent Paced: 0.04 %
Date Time Interrogation Session: 20241119043824
HighPow Impedance: 62 Ohm
Implantable Lead Connection Status: 753985
Implantable Lead Connection Status: 753985
Implantable Lead Implant Date: 20221121
Implantable Lead Implant Date: 20221121
Implantable Lead Location: 753859
Implantable Lead Location: 753860
Implantable Lead Model: 5076
Implantable Pulse Generator Implant Date: 20221121
Lead Channel Impedance Value: 323 Ohm
Lead Channel Impedance Value: 399 Ohm
Lead Channel Impedance Value: 399 Ohm
Lead Channel Pacing Threshold Amplitude: 0.625 V
Lead Channel Pacing Threshold Amplitude: 0.75 V
Lead Channel Pacing Threshold Pulse Width: 0.4 ms
Lead Channel Pacing Threshold Pulse Width: 0.4 ms
Lead Channel Sensing Intrinsic Amplitude: 1.5 mV
Lead Channel Sensing Intrinsic Amplitude: 1.5 mV
Lead Channel Sensing Intrinsic Amplitude: 10.125 mV
Lead Channel Sensing Intrinsic Amplitude: 10.125 mV
Lead Channel Setting Pacing Amplitude: 1 V
Lead Channel Setting Pacing Amplitude: 1.5 V
Lead Channel Setting Pacing Pulse Width: 0.4 ms
Lead Channel Setting Sensing Sensitivity: 0.3 mV
Zone Setting Status: 755011
Zone Setting Status: 755011

## 2023-01-19 ENCOUNTER — Other Ambulatory Visit: Payer: Self-pay | Admitting: Medical Genetics

## 2023-01-26 ENCOUNTER — Other Ambulatory Visit: Payer: Self-pay | Admitting: Medical Genetics

## 2023-02-07 NOTE — Progress Notes (Signed)
Remote ICD transmission.   

## 2023-02-22 ENCOUNTER — Other Ambulatory Visit (HOSPITAL_COMMUNITY): Payer: Medicaid Other | Attending: Medical Genetics

## 2023-03-11 ENCOUNTER — Other Ambulatory Visit (HOSPITAL_COMMUNITY): Payer: Self-pay

## 2023-03-12 ENCOUNTER — Other Ambulatory Visit (HOSPITAL_COMMUNITY): Payer: Self-pay

## 2023-03-15 ENCOUNTER — Other Ambulatory Visit (HOSPITAL_COMMUNITY): Payer: Self-pay

## 2023-03-15 MED ORDER — AMLODIPINE BESYLATE 5 MG PO TABS
5.0000 mg | ORAL_TABLET | Freq: Every day | ORAL | 3 refills | Status: DC
  Filled 2023-03-15: qty 30, 30d supply, fill #0
  Filled 2023-05-21: qty 30, 30d supply, fill #1

## 2023-03-15 MED ORDER — FLUOXETINE HCL 40 MG PO CAPS
40.0000 mg | ORAL_CAPSULE | Freq: Every day | ORAL | 6 refills | Status: AC
  Filled 2023-03-15: qty 30, 30d supply, fill #0
  Filled 2023-05-21: qty 30, 30d supply, fill #1

## 2023-03-15 MED ORDER — METOPROLOL SUCCINATE ER 50 MG PO TB24
50.0000 mg | ORAL_TABLET | Freq: Every day | ORAL | 3 refills | Status: DC
  Filled 2023-03-15: qty 30, 30d supply, fill #0
  Filled 2023-05-21: qty 30, 30d supply, fill #1

## 2023-04-12 ENCOUNTER — Ambulatory Visit (INDEPENDENT_AMBULATORY_CARE_PROVIDER_SITE_OTHER): Payer: Medicaid Other

## 2023-04-12 DIAGNOSIS — I472 Ventricular tachycardia, unspecified: Secondary | ICD-10-CM | POA: Diagnosis not present

## 2023-04-13 LAB — CUP PACEART REMOTE DEVICE CHECK
Battery Remaining Longevity: 105 mo
Battery Voltage: 3.01 V
Brady Statistic AP VP Percent: 0.01 %
Brady Statistic AP VS Percent: 5.87 %
Brady Statistic AS VP Percent: 0.03 %
Brady Statistic AS VS Percent: 94.09 %
Brady Statistic RA Percent Paced: 5.88 %
Brady Statistic RV Percent Paced: 0.03 %
Date Time Interrogation Session: 20250217095030
HighPow Impedance: 67 Ohm
Implantable Lead Connection Status: 753985
Implantable Lead Connection Status: 753985
Implantable Lead Implant Date: 20221121
Implantable Lead Implant Date: 20221121
Implantable Lead Location: 753859
Implantable Lead Location: 753860
Implantable Lead Model: 5076
Implantable Pulse Generator Implant Date: 20221121
Lead Channel Impedance Value: 399 Ohm
Lead Channel Impedance Value: 456 Ohm
Lead Channel Impedance Value: 456 Ohm
Lead Channel Pacing Threshold Amplitude: 0.5 V
Lead Channel Pacing Threshold Amplitude: 0.75 V
Lead Channel Pacing Threshold Pulse Width: 0.4 ms
Lead Channel Pacing Threshold Pulse Width: 0.4 ms
Lead Channel Sensing Intrinsic Amplitude: 1.375 mV
Lead Channel Sensing Intrinsic Amplitude: 1.375 mV
Lead Channel Sensing Intrinsic Amplitude: 11.625 mV
Lead Channel Sensing Intrinsic Amplitude: 11.625 mV
Lead Channel Setting Pacing Amplitude: 1.25 V
Lead Channel Setting Pacing Amplitude: 1.5 V
Lead Channel Setting Pacing Pulse Width: 0.4 ms
Lead Channel Setting Sensing Sensitivity: 0.3 mV
Zone Setting Status: 755011
Zone Setting Status: 755011

## 2023-05-19 NOTE — Progress Notes (Signed)
 Remote ICD transmission.

## 2023-05-19 NOTE — Addendum Note (Signed)
 Addended by: Elease Etienne A on: 05/19/2023 12:59 PM   Modules accepted: Orders

## 2023-05-21 ENCOUNTER — Other Ambulatory Visit (HOSPITAL_COMMUNITY): Payer: Self-pay

## 2023-05-21 ENCOUNTER — Other Ambulatory Visit: Payer: Self-pay | Admitting: Neurology

## 2023-05-23 ENCOUNTER — Other Ambulatory Visit: Payer: Self-pay

## 2023-05-23 ENCOUNTER — Other Ambulatory Visit (HOSPITAL_COMMUNITY): Payer: Self-pay

## 2023-05-23 MED ORDER — LEVETIRACETAM 500 MG PO TABS
500.0000 mg | ORAL_TABLET | Freq: Two times a day (BID) | ORAL | 3 refills | Status: AC
  Filled 2023-05-23: qty 60, 30d supply, fill #0
  Filled 2023-07-25 – 2023-10-26 (×2): qty 60, 30d supply, fill #1
  Filled 2024-03-11: qty 60, 30d supply, fill #2

## 2023-05-23 MED ORDER — LEVETIRACETAM 500 MG PO TABS
500.0000 mg | ORAL_TABLET | Freq: Two times a day (BID) | ORAL | 0 refills | Status: AC
  Filled 2023-05-23: qty 60, 30d supply, fill #0
  Filled 2023-07-25: qty 180, 90d supply, fill #0

## 2023-05-24 ENCOUNTER — Other Ambulatory Visit (HOSPITAL_COMMUNITY): Payer: Self-pay

## 2023-05-25 ENCOUNTER — Other Ambulatory Visit (HOSPITAL_COMMUNITY): Payer: Self-pay

## 2023-05-25 ENCOUNTER — Other Ambulatory Visit: Payer: Self-pay

## 2023-05-25 ENCOUNTER — Ambulatory Visit: Admitting: Neurology

## 2023-05-25 DIAGNOSIS — Z0000 Encounter for general adult medical examination without abnormal findings: Secondary | ICD-10-CM | POA: Diagnosis not present

## 2023-05-25 DIAGNOSIS — F419 Anxiety disorder, unspecified: Secondary | ICD-10-CM | POA: Diagnosis not present

## 2023-05-25 DIAGNOSIS — I10 Essential (primary) hypertension: Secondary | ICD-10-CM | POA: Diagnosis not present

## 2023-05-25 DIAGNOSIS — G40909 Epilepsy, unspecified, not intractable, without status epilepticus: Secondary | ICD-10-CM | POA: Diagnosis not present

## 2023-05-25 DIAGNOSIS — E538 Deficiency of other specified B group vitamins: Secondary | ICD-10-CM | POA: Diagnosis not present

## 2023-05-25 MED ORDER — AMLODIPINE BESYLATE 10 MG PO TABS
10.0000 mg | ORAL_TABLET | Freq: Every day | ORAL | 3 refills | Status: DC
  Filled 2023-05-25: qty 30, 30d supply, fill #0

## 2023-05-25 MED ORDER — LABETALOL HCL 100 MG PO TABS: 100.0000 mg | ORAL_TABLET | Freq: Two times a day (BID) | ORAL | 3 refills | Status: DC

## 2023-05-26 ENCOUNTER — Encounter: Payer: Self-pay | Admitting: Neurology

## 2023-07-12 ENCOUNTER — Ambulatory Visit (INDEPENDENT_AMBULATORY_CARE_PROVIDER_SITE_OTHER): Payer: Medicaid Other

## 2023-07-12 DIAGNOSIS — I472 Ventricular tachycardia, unspecified: Secondary | ICD-10-CM

## 2023-07-13 LAB — CUP PACEART REMOTE DEVICE CHECK
Battery Remaining Longevity: 100 mo
Battery Voltage: 3.01 V
Brady Statistic AP VP Percent: 0.01 %
Brady Statistic AP VS Percent: 7.36 %
Brady Statistic AS VP Percent: 0.03 %
Brady Statistic AS VS Percent: 92.61 %
Brady Statistic RA Percent Paced: 7.36 %
Brady Statistic RV Percent Paced: 0.04 %
Date Time Interrogation Session: 20250520033424
HighPow Impedance: 60 Ohm
Implantable Lead Connection Status: 753985
Implantable Lead Connection Status: 753985
Implantable Lead Implant Date: 20221121
Implantable Lead Implant Date: 20221121
Implantable Lead Location: 753859
Implantable Lead Location: 753860
Implantable Lead Model: 5076
Implantable Pulse Generator Implant Date: 20221121
Lead Channel Impedance Value: 323 Ohm
Lead Channel Impedance Value: 380 Ohm
Lead Channel Impedance Value: 456 Ohm
Lead Channel Pacing Threshold Amplitude: 0.625 V
Lead Channel Pacing Threshold Amplitude: 0.75 V
Lead Channel Pacing Threshold Pulse Width: 0.4 ms
Lead Channel Pacing Threshold Pulse Width: 0.4 ms
Lead Channel Sensing Intrinsic Amplitude: 10 mV
Lead Channel Sensing Intrinsic Amplitude: 10 mV
Lead Channel Sensing Intrinsic Amplitude: 2.125 mV
Lead Channel Sensing Intrinsic Amplitude: 2.125 mV
Lead Channel Setting Pacing Amplitude: 1.25 V
Lead Channel Setting Pacing Amplitude: 1.5 V
Lead Channel Setting Pacing Pulse Width: 0.4 ms
Lead Channel Setting Sensing Sensitivity: 0.3 mV
Zone Setting Status: 755011
Zone Setting Status: 755011

## 2023-07-14 ENCOUNTER — Telehealth: Payer: Self-pay

## 2023-07-14 ENCOUNTER — Ambulatory Visit: Payer: Self-pay | Admitting: Cardiology

## 2023-07-14 NOTE — Telephone Encounter (Signed)
 Alert received from [employer] for 1 VF classified episode on 06/06/23 at 08:04, 4 sec duration, VT at 240 bpm treated with ATP x 1 burst, Type 2 break, cannot exclude SVT due to minimal change in morphology. HF diagnostics currently abnormal.   Attempted to call patient to review symptoms/medication compliance. No answer.   Patient will need f/u in office with EP APP.

## 2023-07-15 NOTE — Telephone Encounter (Signed)
 I called and spoke with the patient. Advised her of ATP on 06/06/23 due to SVT.  Per the patient, she could not recall any symptoms at that time, but does feel like she might be "drinking too much soda."   Advised the patient that her optivol levels had been trending up since the end of April.  Thoracic impedence appears to be trending back to baseline.   The patient is aware that she is overdue for follow up and we need to see her in the clinic.  Scheduled her for a next available with Michaelle Adolphus, PA on 07/27/23 at 10:20 am.   The patient voices understanding and is agreeable.

## 2023-07-25 ENCOUNTER — Encounter (HOSPITAL_BASED_OUTPATIENT_CLINIC_OR_DEPARTMENT_OTHER): Payer: Self-pay

## 2023-07-26 ENCOUNTER — Other Ambulatory Visit: Payer: Self-pay

## 2023-07-27 ENCOUNTER — Other Ambulatory Visit (HOSPITAL_COMMUNITY): Payer: Self-pay

## 2023-07-27 ENCOUNTER — Ambulatory Visit: Attending: Student | Admitting: Student

## 2023-07-27 ENCOUNTER — Encounter: Payer: Self-pay | Admitting: Student

## 2023-07-27 VITALS — BP 112/70 | HR 62 | Ht 67.0 in | Wt 217.6 lb

## 2023-07-27 DIAGNOSIS — Z95810 Presence of automatic (implantable) cardiac defibrillator: Secondary | ICD-10-CM

## 2023-07-27 DIAGNOSIS — I471 Supraventricular tachycardia, unspecified: Secondary | ICD-10-CM | POA: Diagnosis not present

## 2023-07-27 LAB — CUP PACEART INCLINIC DEVICE CHECK
Battery Remaining Longevity: 101 mo
Battery Voltage: 3.01 V
Brady Statistic AP VP Percent: 0.01 %
Brady Statistic AP VS Percent: 12.75 %
Brady Statistic AS VP Percent: 0.02 %
Brady Statistic AS VS Percent: 87.21 %
Brady Statistic RA Percent Paced: 12.75 %
Brady Statistic RV Percent Paced: 0.04 %
Date Time Interrogation Session: 20250604104643
HighPow Impedance: 72 Ohm
Implantable Lead Connection Status: 753985
Implantable Lead Connection Status: 753985
Implantable Lead Implant Date: 20221121
Implantable Lead Implant Date: 20221121
Implantable Lead Location: 753859
Implantable Lead Location: 753860
Implantable Lead Model: 5076
Implantable Pulse Generator Implant Date: 20221121
Lead Channel Impedance Value: 342 Ohm
Lead Channel Impedance Value: 437 Ohm
Lead Channel Impedance Value: 494 Ohm
Lead Channel Pacing Threshold Amplitude: 0.625 V
Lead Channel Pacing Threshold Amplitude: 0.75 V
Lead Channel Pacing Threshold Pulse Width: 0.4 ms
Lead Channel Pacing Threshold Pulse Width: 0.4 ms
Lead Channel Sensing Intrinsic Amplitude: 1.25 mV
Lead Channel Sensing Intrinsic Amplitude: 1.625 mV
Lead Channel Sensing Intrinsic Amplitude: 10.875 mV
Lead Channel Sensing Intrinsic Amplitude: 14.75 mV
Lead Channel Setting Pacing Amplitude: 1 V
Lead Channel Setting Pacing Amplitude: 1.5 V
Lead Channel Setting Pacing Pulse Width: 0.4 ms
Lead Channel Setting Sensing Sensitivity: 0.3 mV
Zone Setting Status: 755011
Zone Setting Status: 755011

## 2023-07-27 MED ORDER — METOPROLOL SUCCINATE ER 50 MG PO TB24
50.0000 mg | ORAL_TABLET | Freq: Two times a day (BID) | ORAL | 3 refills | Status: AC
  Filled 2023-07-27: qty 180, 90d supply, fill #0
  Filled 2023-10-26: qty 180, 90d supply, fill #1
  Filled 2024-03-11: qty 180, 90d supply, fill #2

## 2023-07-27 NOTE — Progress Notes (Signed)
  Electrophysiology Office Note:   ID:  Sharon Hanson, DOB March 26, 1980, MRN [id number]  Primary Cardiologist: None Electrophysiologist: Will Cortland Ding, MD      History of Present Illness:   Sharon Hanson is a 43 y.o. female with h/o HTN, obesity, seizure disorder, and WCT with syncope but negative EP study s/p ICD seen today for acute visit due to ATP.    Patient reports OK. Has been taking it easy since the episode, worried she would receive therapies. Was unaware of episode, though stated she was "Drinking lots of soda" at the time, and also had elevated optivol. Currently,  she denies chest pain, palpitations, PND, orthopnea, nausea, vomiting, dizziness, syncope, edema, weight gain, or early satiety.  Has mild SOB with moderate or more exertion, not with ADLs.  Review of systems complete and found to be negative unless listed in HPI.   EP Information / Studies Reviewed:    EKG is ordered today. Personal review as below.  EKG Interpretation Date/Time:  Wednesday July 27 2023 10:04:37 EDT Ventricular Rate:  62 PR Interval:  126 QRS Duration:  96 QT Interval:  408 QTC Calculation: 414 R Axis:   55  Text Interpretation: Normal sinus rhythm Normal ECG When compared with ECG of 30-Oct-2022 09:02, PREVIOUS ECG IS PRESENT Confirmed by Pilar Bridge [phone]) on 07/27/2023 10:17:35 AM    ICD Interrogation-  reviewed in detail today,  See PACEART report.  Arrhythmia/Device History MDT dual chamber ICD implanted 01/12/2021   Physical Exam:   VS:  BP 112/70 (BP Location: Right Arm, Patient Position: Sitting, Cuff Size: Normal)   Pulse 62   Ht 5\' 7"  (1.702 m)   Wt 217 lb 9.6 oz (98.7 kg)   SpO2 97%   BMI 34.08 kg/m    Wt Readings from Last 3 Encounters:  07/27/23 217 lb 9.6 oz (98.7 kg)  10/30/22 200 lb (90.7 kg)  07/02/22 210 lb (95.3 kg)     GEN: No acute distress  NECK: No JVD; No carotid bruits CARDIAC: Regular rate and rhythm, no murmurs, rubs,  gallops RESPIRATORY:  Clear to auscultation without rales, wheezing or rhonchi  ABDOMEN: Soft, non-tender, non-distended EXTREMITIES:  No edema; No deformity   ASSESSMENT AND PLAN:    WCT with negative EP study WCT with syncope s/p [employer] dual chamber ICD  euvolemic today Stable on an appropriate medical regimen Normal ICD function See Pace Art report No changes today  SVT Increase Toprol  to 50 mg BID. Continue labetalol   HTN Stop amlodipine  to make room for Toprol  increase above.   Disposition:   Follow up with EP Team in 6 months   Signed, Tylene Galla, PA-C

## 2023-07-27 NOTE — Patient Instructions (Signed)
 Medication Instructions:  STOP amlodipine  Increase Metoprolol  to 50 mg twice daily   *If you need a refill on your cardiac medications before your next appointment, please call your pharmacy*  Lab Work: BMET and Magnesium  today If you have labs (blood work) drawn today and your tests are completely normal, you will receive your results only by: MyChart Message (if you have MyChart) OR A paper copy in the mail If you have any lab test that is abnormal or we need to change your treatment, we will call you to review the results.  Testing/Procedures: No testing/procedures were scheduled today  Follow-Up: At [facility], you and your health needs are our priority.  As part of our continuing mission to provide you with exceptional heart care, our providers are all part of one team.  This team includes your primary Cardiologist (physician) and Advanced Practice Providers or APPs (Physician Assistants and Nurse Practitioners) who all work together to provide you with the care you need, when you need it.  Your next appointment:   6 month(s)  Provider:   You may see Will Cortland Ding, MD or one of the following Advanced Practice Providers on your designated Care Team:   Mertha Abrahams, [place] [address] [place], PA-C Suzann Riddle, NP Creighton Doffing, NP    We recommend signing up for the patient portal called "MyChart".  Sign up information is provided on this After Visit Summary.  MyChart is used to connect with patients for Virtual Visits (Telemedicine).  Patients are able to view lab/test results, encounter notes, upcoming appointments, etc.  Non-urgent messages can be sent to your provider as well.   To learn more about what you can do with MyChart, go to [email or web address].

## 2023-07-28 ENCOUNTER — Ambulatory Visit: Payer: Self-pay | Admitting: Student

## 2023-07-28 LAB — BASIC METABOLIC PANEL WITH GFR
BUN/Creatinine Ratio: 14 (ref 9–23)
BUN: 13 mg/dL (ref 6–24)
CO2: 22 mmol/L (ref 20–29)
Calcium: 9.2 mg/dL (ref 8.7–10.2)
Chloride: 102 mmol/L (ref 96–106)
Creatinine, Ser: 0.95 mg/dL (ref 0.57–1.00)
Glucose: 92 mg/dL (ref 70–99)
Potassium: 4 mmol/L (ref 3.5–5.2)
Sodium: 140 mmol/L (ref 134–144)
eGFR: 77 mL/min/{1.73_m2} (ref >59)

## 2023-07-28 LAB — MAGNESIUM: Magnesium: 1.6 mg/dL (ref 1.6–2.3)

## 2023-08-02 ENCOUNTER — Other Ambulatory Visit: Payer: Self-pay | Admitting: *Deleted

## 2023-08-02 MED ORDER — MAGNESIUM OXIDE 400 MG PO TABS: 400.0000 mg | ORAL_TABLET | Freq: Every day | ORAL | Status: AC

## 2023-08-03 ENCOUNTER — Other Ambulatory Visit (HOSPITAL_COMMUNITY): Payer: Self-pay

## 2023-08-04 ENCOUNTER — Other Ambulatory Visit (HOSPITAL_COMMUNITY): Payer: Self-pay

## 2023-08-04 ENCOUNTER — Other Ambulatory Visit: Payer: Self-pay

## 2023-09-06 NOTE — Addendum Note (Signed)
 Addended by: VICCI SELLER A on: 09/06/2023 08:39 AM   Modules accepted: Orders

## 2023-09-06 NOTE — Progress Notes (Signed)
 Remote ICD transmission.

## 2023-10-11 ENCOUNTER — Ambulatory Visit (INDEPENDENT_AMBULATORY_CARE_PROVIDER_SITE_OTHER): Payer: Medicaid Other

## 2023-10-11 DIAGNOSIS — I472 Ventricular tachycardia, unspecified: Secondary | ICD-10-CM | POA: Diagnosis not present

## 2023-10-13 ENCOUNTER — Ambulatory Visit: Payer: Self-pay | Admitting: Cardiology

## 2023-10-13 LAB — CUP PACEART REMOTE DEVICE CHECK
Battery Remaining Longevity: 97 mo
Battery Voltage: 3.01 V
Brady Statistic AP VP Percent: 0.01 %
Brady Statistic AP VS Percent: 12.01 %
Brady Statistic AS VP Percent: 0.03 %
Brady Statistic AS VS Percent: 87.96 %
Brady Statistic RA Percent Paced: 12.01 %
Brady Statistic RV Percent Paced: 0.04 %
Date Time Interrogation Session: 20250819043824
HighPow Impedance: 68 Ohm
Implantable Lead Connection Status: 753985
Implantable Lead Connection Status: 753985
Implantable Lead Implant Date: 20221121
Implantable Lead Implant Date: 20221121
Implantable Lead Location: 753859
Implantable Lead Location: 753860
Implantable Lead Model: 5076
Implantable Pulse Generator Implant Date: 20221121
Lead Channel Impedance Value: 342 Ohm
Lead Channel Impedance Value: 437 Ohm
Lead Channel Impedance Value: 456 Ohm
Lead Channel Pacing Threshold Amplitude: 0.625 V
Lead Channel Pacing Threshold Amplitude: 0.875 V
Lead Channel Pacing Threshold Pulse Width: 0.4 ms
Lead Channel Pacing Threshold Pulse Width: 0.4 ms
Lead Channel Sensing Intrinsic Amplitude: 1.25 mV
Lead Channel Sensing Intrinsic Amplitude: 1.25 mV
Lead Channel Sensing Intrinsic Amplitude: 11.375 mV
Lead Channel Sensing Intrinsic Amplitude: 11.375 mV
Lead Channel Setting Pacing Amplitude: 1.25 V
Lead Channel Setting Pacing Amplitude: 1.5 V
Lead Channel Setting Pacing Pulse Width: 0.4 ms
Lead Channel Setting Sensing Sensitivity: 0.3 mV
Zone Setting Status: 755011
Zone Setting Status: 755011

## 2023-10-26 ENCOUNTER — Other Ambulatory Visit (HOSPITAL_COMMUNITY): Payer: Self-pay

## 2023-11-16 NOTE — Progress Notes (Signed)
Remote ICD Transmission.

## 2023-12-06 ENCOUNTER — Other Ambulatory Visit: Payer: Self-pay | Admitting: Medical Genetics

## 2023-12-06 DIAGNOSIS — Z006 Encounter for examination for normal comparison and control in clinical research program: Secondary | ICD-10-CM

## 2024-01-10 ENCOUNTER — Ambulatory Visit: Payer: Medicaid Other

## 2024-01-10 DIAGNOSIS — I472 Ventricular tachycardia, unspecified: Secondary | ICD-10-CM

## 2024-01-11 LAB — CUP PACEART REMOTE DEVICE CHECK
Battery Remaining Longevity: 93 mo
Battery Voltage: 3.01 V
Brady Statistic AP VP Percent: 0.01 %
Brady Statistic AP VS Percent: 15.24 %
Brady Statistic AS VP Percent: 0.03 %
Brady Statistic AS VS Percent: 84.73 %
Brady Statistic RA Percent Paced: 15.23 %
Brady Statistic RV Percent Paced: 0.04 %
Date Time Interrogation Session: 20251118012502
HighPow Impedance: 69 Ohm
Implantable Lead Connection Status: 753985
Implantable Lead Connection Status: 753985
Implantable Lead Implant Date: 20221121
Implantable Lead Implant Date: 20221121
Implantable Lead Location: 753859
Implantable Lead Location: 753860
Implantable Lead Model: 5076
Implantable Pulse Generator Implant Date: 20221121
Lead Channel Impedance Value: 380 Ohm
Lead Channel Impedance Value: 437 Ohm
Lead Channel Impedance Value: 456 Ohm
Lead Channel Pacing Threshold Amplitude: 0.5 V
Lead Channel Pacing Threshold Amplitude: 0.75 V
Lead Channel Pacing Threshold Pulse Width: 0.4 ms
Lead Channel Pacing Threshold Pulse Width: 0.4 ms
Lead Channel Sensing Intrinsic Amplitude: 1.25 mV
Lead Channel Sensing Intrinsic Amplitude: 1.25 mV
Lead Channel Sensing Intrinsic Amplitude: 12.125 mV
Lead Channel Sensing Intrinsic Amplitude: 12.125 mV
Lead Channel Setting Pacing Amplitude: 1.25 V
Lead Channel Setting Pacing Amplitude: 1.5 V
Lead Channel Setting Pacing Pulse Width: 0.4 ms
Lead Channel Setting Sensing Sensitivity: 0.3 mV
Zone Setting Status: 755011
Zone Setting Status: 755011

## 2024-01-13 NOTE — Progress Notes (Signed)
 Remote ICD Transmission

## 2024-01-15 ENCOUNTER — Ambulatory Visit: Payer: Self-pay | Admitting: Cardiology

## 2024-03-11 ENCOUNTER — Other Ambulatory Visit (HOSPITAL_COMMUNITY): Payer: Self-pay

## 2024-03-11 ENCOUNTER — Other Ambulatory Visit: Payer: Self-pay | Admitting: Neurology

## 2024-03-12 ENCOUNTER — Other Ambulatory Visit: Payer: Self-pay

## 2024-03-13 ENCOUNTER — Other Ambulatory Visit (HOSPITAL_COMMUNITY): Payer: Self-pay

## 2024-03-13 ENCOUNTER — Other Ambulatory Visit: Payer: Self-pay

## 2024-03-13 MED ORDER — HYDROXYZINE HCL 25 MG PO TABS
12.5000 mg | ORAL_TABLET | Freq: Three times a day (TID) | ORAL | 0 refills | Status: AC | PRN
  Filled 2024-03-13: qty 30, 10d supply, fill #0
